# Patient Record
Sex: Female | Born: 1950
Health system: Southern US, Community
[De-identification: ages and names within clinical notes are randomized; demographics above are authoritative.]

## PROBLEM LIST (undated history)

## (undated) DIAGNOSIS — K649 Unspecified hemorrhoids: Secondary | ICD-10-CM

## (undated) DIAGNOSIS — I2510 Atherosclerotic heart disease of native coronary artery without angina pectoris: Secondary | ICD-10-CM

## (undated) DIAGNOSIS — R112 Nausea with vomiting, unspecified: Secondary | ICD-10-CM

## (undated) DIAGNOSIS — N3090 Cystitis, unspecified without hematuria: Secondary | ICD-10-CM

## (undated) DIAGNOSIS — Z9989 Dependence on other enabling machines and devices: Secondary | ICD-10-CM

## (undated) DIAGNOSIS — I2584 Coronary atherosclerosis due to calcified coronary lesion: Secondary | ICD-10-CM

## (undated) DIAGNOSIS — G4733 Obstructive sleep apnea (adult) (pediatric): Secondary | ICD-10-CM

## (undated) DIAGNOSIS — E785 Hyperlipidemia, unspecified: Secondary | ICD-10-CM

## (undated) DIAGNOSIS — K219 Gastro-esophageal reflux disease without esophagitis: Secondary | ICD-10-CM

## (undated) DIAGNOSIS — M1990 Unspecified osteoarthritis, unspecified site: Secondary | ICD-10-CM

## (undated) DIAGNOSIS — R0902 Hypoxemia: Secondary | ICD-10-CM

## (undated) DIAGNOSIS — Z9889 Other specified postprocedural states: Secondary | ICD-10-CM

## (undated) DIAGNOSIS — I10 Essential (primary) hypertension: Secondary | ICD-10-CM

## (undated) DIAGNOSIS — G4730 Sleep apnea, unspecified: Secondary | ICD-10-CM

## (undated) DIAGNOSIS — I8390 Asymptomatic varicose veins of unspecified lower extremity: Secondary | ICD-10-CM

## (undated) HISTORY — DX: Hypoxemia: R09.02

## (undated) HISTORY — DX: Obstructive sleep apnea (adult) (pediatric): Z99.89

## (undated) HISTORY — PX: POLYPECTOMY: SHX149

## (undated) HISTORY — DX: Atherosclerotic heart disease of native coronary artery without angina pectoris: I25.10

## (undated) HISTORY — PX: PAROTID GLAND TUMOR EXCISION: SHX5221

## (undated) HISTORY — PX: DILATION AND CURETTAGE OF UTERUS: SHX78

## (undated) HISTORY — PX: COLONOSCOPY W/ POLYPECTOMY: SHX1380

## (undated) HISTORY — PX: KNEE SURGERY: SHX244

## (undated) HISTORY — PX: WRIST SURGERY: SHX841

## (undated) HISTORY — PX: TONSILLECTOMY: SUR1361

## (undated) HISTORY — PX: WISDOM TOOTH EXTRACTION: SHX21

## (undated) HISTORY — PX: TOTAL KNEE ARTHROPLASTY: SHX125

## (undated) HISTORY — DX: Sleep apnea, unspecified: G47.30

## (undated) HISTORY — DX: Coronary atherosclerosis due to calcified coronary lesion: I25.84

## (undated) HISTORY — DX: Obstructive sleep apnea (adult) (pediatric): G47.33

## (undated) HISTORY — DX: Hyperlipidemia, unspecified: E78.5

## (undated) HISTORY — PX: EYE SURGERY: SHX253

## (undated) HISTORY — DX: Gastro-esophageal reflux disease without esophagitis: K21.9

## (undated) HISTORY — DX: Essential (primary) hypertension: I10

## (undated) HISTORY — PX: TARSAL TUNNEL RELEASE: SUR1099

---

## 1998-12-27 ENCOUNTER — Ambulatory Visit (HOSPITAL_COMMUNITY): Admission: RE | Admit: 1998-12-27 | Discharge: 1998-12-27 | Payer: Self-pay | Admitting: Gastroenterology

## 1999-12-06 ENCOUNTER — Encounter: Admission: RE | Admit: 1999-12-06 | Discharge: 1999-12-06 | Payer: Self-pay | Admitting: Obstetrics and Gynecology

## 1999-12-06 ENCOUNTER — Encounter: Payer: Self-pay | Admitting: Obstetrics and Gynecology

## 1999-12-26 ENCOUNTER — Encounter: Admission: RE | Admit: 1999-12-26 | Discharge: 1999-12-26 | Payer: Self-pay | Admitting: Otolaryngology

## 1999-12-26 ENCOUNTER — Encounter: Payer: Self-pay | Admitting: Otolaryngology

## 2000-02-15 ENCOUNTER — Encounter (INDEPENDENT_AMBULATORY_CARE_PROVIDER_SITE_OTHER): Payer: Self-pay

## 2000-02-15 ENCOUNTER — Other Ambulatory Visit: Admission: RE | Admit: 2000-02-15 | Discharge: 2000-02-15 | Payer: Self-pay | Admitting: Obstetrics and Gynecology

## 2000-06-11 ENCOUNTER — Encounter (INDEPENDENT_AMBULATORY_CARE_PROVIDER_SITE_OTHER): Payer: Self-pay

## 2000-06-11 ENCOUNTER — Ambulatory Visit (HOSPITAL_COMMUNITY): Admission: RE | Admit: 2000-06-11 | Discharge: 2000-06-11 | Payer: Self-pay | Admitting: Obstetrics and Gynecology

## 2000-12-09 ENCOUNTER — Encounter: Payer: Self-pay | Admitting: Family Medicine

## 2000-12-09 ENCOUNTER — Encounter: Admission: RE | Admit: 2000-12-09 | Discharge: 2000-12-09 | Payer: Self-pay | Admitting: Family Medicine

## 2001-12-24 ENCOUNTER — Encounter: Admission: RE | Admit: 2001-12-24 | Discharge: 2001-12-24 | Payer: Self-pay | Admitting: Family Medicine

## 2001-12-24 ENCOUNTER — Encounter: Payer: Self-pay | Admitting: Family Medicine

## 2002-12-28 ENCOUNTER — Encounter: Payer: Self-pay | Admitting: Family Medicine

## 2002-12-28 ENCOUNTER — Encounter: Admission: RE | Admit: 2002-12-28 | Discharge: 2002-12-28 | Payer: Self-pay | Admitting: Family Medicine

## 2003-08-31 ENCOUNTER — Encounter: Admission: RE | Admit: 2003-08-31 | Discharge: 2003-08-31 | Payer: Self-pay | Admitting: Family Medicine

## 2003-09-28 ENCOUNTER — Encounter: Admission: RE | Admit: 2003-09-28 | Discharge: 2003-09-28 | Payer: Self-pay | Admitting: Family Medicine

## 2004-01-06 ENCOUNTER — Ambulatory Visit (HOSPITAL_COMMUNITY): Admission: RE | Admit: 2004-01-06 | Discharge: 2004-01-06 | Payer: Self-pay | Admitting: Family Medicine

## 2004-05-16 ENCOUNTER — Ambulatory Visit (HOSPITAL_COMMUNITY): Admission: RE | Admit: 2004-05-16 | Discharge: 2004-05-16 | Payer: Self-pay | Admitting: Gastroenterology

## 2004-06-08 ENCOUNTER — Other Ambulatory Visit: Admission: RE | Admit: 2004-06-08 | Discharge: 2004-06-08 | Payer: Self-pay | Admitting: Obstetrics and Gynecology

## 2004-07-17 ENCOUNTER — Inpatient Hospital Stay (HOSPITAL_COMMUNITY): Admission: RE | Admit: 2004-07-17 | Discharge: 2004-07-20 | Payer: Self-pay | Admitting: Orthopedic Surgery

## 2004-07-17 ENCOUNTER — Ambulatory Visit: Payer: Self-pay | Admitting: Physical Medicine & Rehabilitation

## 2005-01-16 ENCOUNTER — Ambulatory Visit (HOSPITAL_COMMUNITY): Admission: RE | Admit: 2005-01-16 | Discharge: 2005-01-16 | Payer: Self-pay | Admitting: Obstetrics and Gynecology

## 2005-06-14 ENCOUNTER — Other Ambulatory Visit: Admission: RE | Admit: 2005-06-14 | Discharge: 2005-06-14 | Payer: Self-pay | Admitting: Obstetrics and Gynecology

## 2005-07-02 ENCOUNTER — Inpatient Hospital Stay (HOSPITAL_COMMUNITY): Admission: RE | Admit: 2005-07-02 | Discharge: 2005-07-05 | Payer: Self-pay | Admitting: Orthopedic Surgery

## 2006-01-23 ENCOUNTER — Ambulatory Visit (HOSPITAL_COMMUNITY): Admission: RE | Admit: 2006-01-23 | Discharge: 2006-01-23 | Payer: Self-pay | Admitting: Family Medicine

## 2006-05-23 ENCOUNTER — Encounter: Admission: RE | Admit: 2006-05-23 | Discharge: 2006-05-23 | Payer: Self-pay | Admitting: Orthopedic Surgery

## 2006-08-09 ENCOUNTER — Ambulatory Visit (HOSPITAL_COMMUNITY): Admission: RE | Admit: 2006-08-09 | Discharge: 2006-08-09 | Payer: Self-pay | Admitting: Orthopedic Surgery

## 2006-12-06 ENCOUNTER — Ambulatory Visit (HOSPITAL_COMMUNITY): Admission: RE | Admit: 2006-12-06 | Discharge: 2006-12-06 | Payer: Self-pay | Admitting: Orthopedic Surgery

## 2007-01-29 ENCOUNTER — Ambulatory Visit (HOSPITAL_COMMUNITY): Admission: RE | Admit: 2007-01-29 | Discharge: 2007-01-29 | Payer: Self-pay | Admitting: Obstetrics and Gynecology

## 2007-02-06 ENCOUNTER — Encounter: Admission: RE | Admit: 2007-02-06 | Discharge: 2007-02-06 | Payer: Self-pay | Admitting: Obstetrics and Gynecology

## 2007-08-11 ENCOUNTER — Ambulatory Visit (HOSPITAL_COMMUNITY): Admission: RE | Admit: 2007-08-11 | Discharge: 2007-08-11 | Payer: Self-pay | Admitting: Obstetrics and Gynecology

## 2007-08-11 ENCOUNTER — Encounter (INDEPENDENT_AMBULATORY_CARE_PROVIDER_SITE_OTHER): Payer: Self-pay | Admitting: Obstetrics and Gynecology

## 2008-02-10 ENCOUNTER — Ambulatory Visit (HOSPITAL_COMMUNITY): Admission: RE | Admit: 2008-02-10 | Discharge: 2008-02-10 | Payer: Self-pay | Admitting: Family Medicine

## 2008-05-17 ENCOUNTER — Ambulatory Visit: Payer: Self-pay | Admitting: Vascular Surgery

## 2008-10-22 HISTORY — PX: COLONOSCOPY: SHX174

## 2009-02-16 ENCOUNTER — Ambulatory Visit (HOSPITAL_COMMUNITY): Admission: RE | Admit: 2009-02-16 | Discharge: 2009-02-16 | Payer: Self-pay | Admitting: Obstetrics and Gynecology

## 2009-05-11 ENCOUNTER — Ambulatory Visit: Payer: Self-pay | Admitting: Internal Medicine

## 2009-05-24 ENCOUNTER — Encounter: Payer: Self-pay | Admitting: Internal Medicine

## 2009-05-24 ENCOUNTER — Ambulatory Visit: Payer: Self-pay | Admitting: Internal Medicine

## 2009-05-27 ENCOUNTER — Encounter: Payer: Self-pay | Admitting: Internal Medicine

## 2010-02-21 ENCOUNTER — Ambulatory Visit (HOSPITAL_COMMUNITY): Admission: RE | Admit: 2010-02-21 | Discharge: 2010-02-21 | Payer: Self-pay | Admitting: Family Medicine

## 2010-11-12 ENCOUNTER — Encounter: Payer: Self-pay | Admitting: Obstetrics and Gynecology

## 2010-12-28 ENCOUNTER — Encounter: Payer: Self-pay | Admitting: Internal Medicine

## 2010-12-28 ENCOUNTER — Encounter (INDEPENDENT_AMBULATORY_CARE_PROVIDER_SITE_OTHER): Payer: BC Managed Care – PPO | Admitting: Internal Medicine

## 2010-12-28 DIAGNOSIS — K219 Gastro-esophageal reflux disease without esophagitis: Secondary | ICD-10-CM

## 2010-12-28 DIAGNOSIS — E559 Vitamin D deficiency, unspecified: Secondary | ICD-10-CM | POA: Insufficient documentation

## 2010-12-28 DIAGNOSIS — Z860100 Personal history of colon polyps, unspecified: Secondary | ICD-10-CM | POA: Insufficient documentation

## 2010-12-28 DIAGNOSIS — Z0000 Encounter for general adult medical examination without abnormal findings: Secondary | ICD-10-CM

## 2010-12-28 DIAGNOSIS — I10 Essential (primary) hypertension: Secondary | ICD-10-CM

## 2010-12-28 DIAGNOSIS — J309 Allergic rhinitis, unspecified: Secondary | ICD-10-CM | POA: Insufficient documentation

## 2011-01-02 NOTE — Assessment & Plan Note (Signed)
Summary: NEW/CPX/PH/PH   Vital Signs:  Patient profile:   60 year old female Height:      65.75 inches Weight:      264.4 pounds BMI:     43.16 Temp:     98.6 degrees F oral Pulse rate:   64 / minute Resp:     14 per minute BP sitting:   118 / 82  (left arm) Cuff size:   large  Vitals Entered By: Shonna Chock CMA (December 28, 2010 9:52 AM) CC: New patient: CPX-NOT fasting , Heartburn   CC:  New patient: CPX-NOT fasting  and Heartburn.  History of Present Illness:    [name]) is here for a physical; she has some constipation ? related to ACE-I/HCTZ every other day . She reports edema and  intermittent fatigue, but denies lightheadedness, urinary frequency,  headaches, chest pain, exercise intolerance, dyspnea, palpitations, and syncope.  The patient reports that dietary compliance has been good.  The patient reports exercising 6X/ week , walking  50  min.  Adjunctive measures currently used by the patient include salt restriction.      GERD: She  denies acid reflux, sour taste in mouth, epigastric pain, trouble swallowing, weight loss, and weight gain.  The patient denies the following alarm features: melena, dysphagia, hematemesis, and vomiting.  Prior evaluation has included EGD.    Preventive Screening-Counseling & Management  Alcohol-Tobacco     Smoking Status: quit  Allergies (verified): No Known Drug Allergies  Past History:  Past Medical History: Allergic rhinitis GERD Hypertension Colonic polyps, PMH  of, Hyperplastic ; DUD , PMH of 1970 Vitamin D deficiency , PMH of   Past Surgical History: Hip surgery 1968 post BB injury;bladder surgery 1972 ; Wrist surgery  1995 & 1996  post pregnancy; Benign Parotid tumor resection 1996;Ankle surgery ; Knee sugery X7( 2 TKR), now seeing  Dr Despina Hick Tonsillectomy; Colonoscopy every 5 years, last 2010; Upper Endo: neg , Dr Sherin Quarry; Dewaine Conger surgeries X2 bilaterally  Family History: Father: Living, Colon/Prostate cancer,  HTN Mother: Living: Colon cancer;MGM: Colon Cancer Siblings: 2 Sister Living  Social History: Retired Married Former Smoker Alcohol use-yes; 1x /month Smoking Status:  quit  Review of Systems  The patient denies anorexia, fever, vision loss, decreased hearing, prolonged cough, hemoptysis, hematuria, suspicious skin lesions, depression, unusual weight change, abnormal bleeding, enlarged lymph nodes, and angioedema.    Physical Exam  General:  well-nourished; alert,appropriate and cooperative throughout examination Head:  Normocephalic and atraumatic without obvious abnormalities. Eyes:  No corneal or conjunctival inflammation noted. Perrla. Funduscopic exam benign, without hemorrhages, exudates or papilledema.  Ears:  External ear exam shows no significant lesions or deformities.  Otoscopic examination reveals clear canals, tympanic membranes are intact bilaterally without bulging, retraction, inflammation or discharge. Hearing is grossly normal bilaterally. Nose:  External nasal examination shows no deformity or inflammation. Nasal mucosa are pink and moist without lesions or exudates. Mouth:  Oral mucosa and oropharynx without lesions or exudates.  Teeth in good repair. Neck:  No deformities, masses, or tenderness noted. Lungs:  Normal respiratory effort, chest expands symmetrically. Lungs are clear to auscultation, no crackles or wheezes. Heart:  Normal rate and regular rhythm. S1 and S2 normal without gallop, murmur, click, rub. S4 Abdomen:  Bowel sounds positive,abdomen soft and non-tender without masses, organomegaly or hernias noted. Genitalia:  as per Gyn Msk:  No deformity or scoliosis noted of thoracic or lumbar spine.   Pulses:  R and L carotid,radial,dorsalis pedis  and posterior tibial pulses are full and equal bilaterally Extremities:  No clubbing, cyanosis, edema, or deformity noted with normal full range of motion of all joints.   Neurologic:  alert & oriented X3 and DTRs  symmetrical ; 1/2 + @ knees Skin:  Intact without suspicious lesions or rashes Cervical Nodes:  No lymphadenopathy noted Axillary Nodes:  No palpable lymphadenopathy Psych:  memory intact for recent and remote, normally interactive, and good eye contact.     Impression & Recommendations:  Problem # 1:  ROUTINE GENERAL MEDICAL EXAM@HEALTH  CARE FACL (ICD-V70.0)  Orders: EKG w/ Interpretation (93000)  Problem # 2:  HYPERTENSION (ICD-401.9)  Her updated medication list for this problem includes:    Furosemide 40 Mg Tabs (Furosemide) .Marland KitchenMarland KitchenMarland KitchenMarland Kitchen 3 times weekly    Zestoretic 10-12.5 Mg Tabs (Lisinopril-hydrochlorothiazide) .Marland Kitchen... 1/2 by mouth every other day  Problem # 3:  GERD (ICD-530.81)  Her updated medication list for this problem includes:    Zegerid 40-1100 Mg Caps (Omeprazole-sodium bicarbonate) .Marland Kitchen... 1 by mouth once daily  Problem # 4:  COLONIC POLYPS, HX OF (ICD-V12.72)  Problem # 5:  VITAMIN D DEFICIENCY (ICD-268.9)  Complete Medication List: 1)  Zegerid 40-1100 Mg Caps (Omeprazole-sodium bicarbonate) .Marland Kitchen.. 1 by mouth once daily 2)  Furosemide 40 Mg Tabs (Furosemide) .... 3 times weekly 3)  Vitamin D 1000 Unit Tabs (Cholecalciferol) .... Two times a day 4)  Klor-con 20 Meq Pack (Potassium chloride) .... 3 times weekly 5)  Zestoretic 10-12.5 Mg Tabs (Lisinopril-hydrochlorothiazide) .... 1/2 by mouth every other day 6)  Nasonex 50 Mcg/act Susp (Mometasone furoate) .... 2 spray in each nostril as needed 7)  Multivitamins Tabs (Multiple vitamin) .Marland Kitchen.. 1 by mouth once daily 8)  Calcium 600 Mg Tabs (Calcium) .... Two times a day  Patient Instructions: 1)  please schedule  fasting labs; Codes: V70.0, 401.9, 530.81, 268.9: 2)  BMP; 3)  Hepatic Panel; 4)  Lipid Panel ; 5)  TSH ; 6)  CBC w/ Diff. 7)  Check your Blood Pressure regularly. If it is above: 135/85 ON AVERAGE OFF Lisinopril/HCTZ  you should make an appointment.   Orders Added: 1)  Est. Patient 40-64 years [99396] 2)   EKG w/ Interpretation [93000] 3)  New Patient 40-64 years [99386]   Immunization History:  Tetanus/Td Immunization History:    Tetanus/Td:  historical (10/22/2002)  Pneumovax Immunization History:    Pneumovax:  historical (10/22/2004)   Immunization History:  Tetanus/Td Immunization History:    Tetanus/Td:  Historical (10/22/2002)  Pneumovax Immunization History:    Pneumovax:  Historical (10/22/2004)

## 2011-01-04 ENCOUNTER — Other Ambulatory Visit: Payer: Self-pay | Admitting: Internal Medicine

## 2011-01-04 ENCOUNTER — Encounter (INDEPENDENT_AMBULATORY_CARE_PROVIDER_SITE_OTHER): Payer: Self-pay | Admitting: *Deleted

## 2011-01-04 ENCOUNTER — Other Ambulatory Visit: Payer: BC Managed Care – PPO

## 2011-01-04 DIAGNOSIS — K219 Gastro-esophageal reflux disease without esophagitis: Secondary | ICD-10-CM

## 2011-01-04 DIAGNOSIS — Z0000 Encounter for general adult medical examination without abnormal findings: Secondary | ICD-10-CM

## 2011-01-04 DIAGNOSIS — I10 Essential (primary) hypertension: Secondary | ICD-10-CM

## 2011-01-04 DIAGNOSIS — E559 Vitamin D deficiency, unspecified: Secondary | ICD-10-CM

## 2011-01-04 LAB — BASIC METABOLIC PANEL
BUN: 19 mg/dL (ref 6–23)
CO2: 31 mEq/L (ref 19–32)
GFR: 65.02 mL/min (ref >60.00)
Potassium: 4.6 mEq/L (ref 3.5–5.1)

## 2011-01-04 LAB — HEPATIC FUNCTION PANEL
ALT: 23 U/L (ref 0–35)
AST: 26 U/L (ref 0–37)
Albumin: 3.8 g/dL (ref 3.5–5.2)
Alkaline Phosphatase: 62 U/L (ref 39–117)
Bilirubin, Direct: 0.2 mg/dL (ref 0.0–0.3)
Total Bilirubin: 1 mg/dL (ref 0.3–1.2)

## 2011-01-04 LAB — CBC WITH DIFFERENTIAL/PLATELET
Basophils Absolute: 0 10*3/uL (ref 0.0–0.1)
Eosinophils Absolute: 0.1 10*3/uL (ref 0.0–0.7)
HCT: 43.2 % (ref 36.0–46.0)
Lymphocytes Relative: 28.7 % (ref 12.0–46.0)
Lymphs Abs: 1.8 10*3/uL (ref 0.7–4.0)
MCV: 89.2 fl (ref 78.0–100.0)
Monocytes Absolute: 0.6 10*3/uL (ref 0.1–1.0)
Platelets: 215 10*3/uL (ref 150.0–400.0)
RBC: 4.85 Mil/uL (ref 3.87–5.11)
RDW: 14.1 % (ref 11.5–14.6)
WBC: 6.2 10*3/uL (ref 4.5–10.5)

## 2011-02-01 ENCOUNTER — Other Ambulatory Visit (HOSPITAL_COMMUNITY): Payer: Self-pay | Admitting: Obstetrics and Gynecology

## 2011-02-01 DIAGNOSIS — Z1231 Encounter for screening mammogram for malignant neoplasm of breast: Secondary | ICD-10-CM

## 2011-02-23 ENCOUNTER — Ambulatory Visit (HOSPITAL_COMMUNITY)
Admission: RE | Admit: 2011-02-23 | Discharge: 2011-02-23 | Disposition: A | Discharge status: Home / Self Care | Payer: BC Managed Care – PPO | Source: Ambulatory Visit | Attending: Obstetrics and Gynecology | Admitting: Obstetrics and Gynecology

## 2011-02-23 DIAGNOSIS — Z1231 Encounter for screening mammogram for malignant neoplasm of breast: Secondary | ICD-10-CM

## 2011-03-06 NOTE — Procedures (Signed)
DUPLEX DEEP VENOUS EXAM - LOWER EXTREMITY   INDICATION:  Bilateral leg swelling for approximately 10 years.  Father  has history of phlebitis.   HISTORY:  Edema:  Yes.  Trauma/Surgery:  Bilateral knee replacements in 2005 and 2006.  Pain:  In left foot.  PE:  No.  Previous DVT:  No.  Anticoagulants:  No.  Other:   DUPLEX EXAM:                CFV   SFV   PopV  PTV    GSV                R  L  R  L  R  L  R   L  R  L  Thrombosis    o  o  o  o  o  o  o   o  P  Spontaneous   +  +  +  +  +  +  +   +  P  Phasic        +  +  +  +  +  +  +   +  P  Augmentation  +  +  +  +  +  +  +   +  P  Compressible  +  +  +  +  +  +  +   +  P  Competent     +  0  +  +  +  +  +   +  +   Legend:  + - yes  o - no  p - partial  D - decreased   IMPRESSION:  1. No evidence of deep vein thrombosis bilaterally, however unable to      fully visualize peroneal veins bilaterally.  2. What appears to be chronic thrombus is noted in the greater      saphenous vein from the level of the ankle to the P/M calf.  3. Reflux is noted in the left common femoral vein at the      saphenofemoral junction.       _____________________________  Quita Skye Hart [place], M.D.   PB/MEDQ  D:  05/17/2008  T:  05/17/2008  Job:  [id number]

## 2011-03-06 NOTE — Consult Note (Signed)
VASCULAR SURGERY CONSULTATION   Sandra Stevens, Sandra Stevens  DOB:  07-28-1951                                       05/17/2008  CHART#:[id number]   This is a 60 year old female referred by Dr. Smith Mince for vascular  surgery consultation because of lower extremity edema and pain in the  left foot.  This patient states that she has had bilateral leg edema for  at least 10 years, left slightly worse than the right.  Causes  hypersensitivity and tenderness in the pretibial areas bilaterally with  progressive swelling as the day wears on.  She has no history of deep  venous thrombosis, thrombophlebitis, pulmonary emboli or any clotting  problems.  She has had no stasis ulcers or bleeding and has no  significant varicose veins.  She has tried wearing elastic stockings in  the past, but because they are so difficult to apply and to wear, she  has not worn them on a regular basis.  She has pain in her left foot  which is low grade discomfort which seems to be worse while she is lying  down.  She is able to walk about one mile per day.   PAST MEDICAL HISTORY:  Negative for diabetes, hypertension,  hyperlipidemia, coronary artery disease, COPD or stroke.   PREVIOUS SURGERY:  Bilateral knee replacements in 2005 and 2006 by Dr.  Salvatore Marvel.   FAMILY HISTORY:  Positive for phlebitis in her father.  Negative for  coronary artery disease, diabetes or stroke.   SOCIAL HISTORY:  She is married, has one child, is retired.  She has not  smoked cigarettes in 10 years and drinks occasional alcohol.   REVIEW OF SYSTEMS:  CARDIOVASCULAR:  Denies any chest pain, dyspnea on  exertion, PND, orthopnea.  RESPIRATORY:  No productive cough, bronchitis, asthma, or wheezing.  GENITOURINARY:  Does have urinary frequency.  EXTREMITIES:  Lower extremity discomfort as described above.   ALLERGIES:  None known.   MEDICATIONS:  Please see health history form.   PHYSICAL EXAM:  Blood pressure is  176/100, heart rate 71, respirations  14.  General:  She is an obese middle-aged female in no apparent  distress, alert and oriented x3.  Neck:  Supple.  3+ carotid pulses  palpable.  No bruits are audible.  Neurologic:  Normal.  No palpable  adenopathy in the neck.  Chest:  Clear to auscultation.  Cardiovascular:  Regular rhythm with no murmurs.  Upper extremity pulses 3+ bilaterally.  Abdomen is obese.  No palpable masses.  She has 3+ femoral, popliteal  and 2+ dorsalis pedis pulses palpable bilaterally.  Both feet well  perfused.  She has diffuse edema from the mid thighs down to the ankles,  slightly worse on the left than the right.  There is some mild erythema  associated with the edema.  No stasis ulcers hyperpigmentation or  significant varicosities are noted.  Both feet are well perfused with no  evidence of any ischemia.   Venous duplex exam was performed bilaterally.  There is no evidence of  deep venous thrombosis or serious severe reflux in the greater saphenous  veins.  She does have some possible old thrombus in the great saphenous  vein on the left and does have some reflux in the left common femoral  vein at the saphenofemoral junction.   I do not  see anything on a venous study to definitely account for why  she would be having severe bilateral edema.  I discussed with her at  length the treatment of this edema of unknown etiology as there are no  surgical procedures to alleviate this.  The treatment with the  diuretics, which she is currently taking, as well as elevation of the  legs at night and wearing effective long leg stockings during the day if  feasible if not short leg stockings to minimize swelling.  Unfortunately, I have nothing else to offer her regarding treatment  options.   Quita Skye Hart [place], M.D.  Electronically Signed  JDL/MEDQ  D:  05/17/2008  T:  05/18/2008  Job:  [id number]   cc:   Talmadge Coventry, M.D.

## 2011-03-06 NOTE — Op Note (Signed)
NAME:  Sandra Stevens, Sandra Stevens               ACCOUNT NO.:  [id number]   MEDICAL RECORD NO.:  [id number]          PATIENT TYPE:  AMB   LOCATION:  SDC                           FACILITY:  [facility]   PHYSICIAN:  Juluis Mire, M.D.   DATE OF BIRTH:  [birth year]   DATE OF PROCEDURE:  08/11/2007  DATE OF DISCHARGE:                               OPERATIVE REPORT   PREOPERATIVE DIAGNOSIS:  Postmenopausal bleeding with endometrial polyp.   POSTOPERATIVE DIAGNOSIS:  Postmenopausal bleeding with endometrial  polyp.   PROCEDURE:  Paracervical block.  Cervical dilatation with hysteroscopy.  Multiple endometrial resections and curettings.   SURGEON:  Juluis Mire, M.D.   ANESTHESIA:  With sedation with paracervical block.   ESTIMATED BLOOD LOSS:  Minimal.   PACKS AND DRAINS:  None.   INTRAOPERATIVE BLOOD REPLACED:  None.   COMPLICATIONS:  None.   INDICATIONS:  Noted in history and physical.   PROCEDURE:  Patient was taken to OR, placed supine position.  After  sedation was placed in dorsal lithotomy position using the Allen  stirrups.  The patient then draped sterile field.  Speculum was placed  in vaginal vault.  The cervix and vagina cleansed Betadine.  Paracervical blocks administered using 1% Xylocaine with epinephrine.  Cervix grasped with single toothed tenaculum.  Uterus sounded 8 cm.  Cervix serially dilated size 35 Pratt dilator.  Operative hysteroscope  was introduced into cavity was distended using sorbitol.  No polyp was  noted but it may have been disrupted by the dilation.  We did multiple  anterior and posterior wall biopsies as well as curettings.  There is no  signs of perforation or complications.  Total deficit was 85 mL.  At  this point time single-tooth tenaculum, speculum removed.  The patient  taken out of dorsal position.  Once alert, transferred to recovery in  good condition.  Sponge, instrument and needle count was correct by  circulating nurse x2 dictation.      Juluis Mire, M.D.  Electronically Signed     JSM/MEDQ  D:  08/11/2007  T:  08/11/2007  Job:  [id number]

## 2011-03-06 NOTE — H&P (Signed)
NAME:  Sandra Stevens, Sandra Stevens NO.:  [id number]   MEDICAL RECORD NO.:  [id number]          PATIENT TYPE:  AMB   LOCATION:  SDC                           FACILITY:  [facility]   PHYSICIAN:  Juluis Mire, M.D.   DATE OF BIRTH:  1951-01-12   DATE OF ADMISSION:  08/11/2007  DATE OF DISCHARGE:                              HISTORY & PHYSICAL   The patient is a 60 year old, gravida 1, para 1, postmenopausal patient  who presents for hysteroscopy.   The patient experienced postmenopausal bleeding, had a saline infusion  ultrasound with endometrial polyps. Therefore, presents for  hysteroscopic evaluation and resection.   ALLERGIES:  No known drug allergies listed.   MEDICATIONS:  Nexium.   PAST MEDICAL HISTORY:  Usual childhood diseases without any significant  sequelae.  Does have a history of gastroesophageal reflux disease and  ulcer disease, also history of migraine headaches.   PAST SURGICAL HISTORY:  1. Has had tonsillectomy and adenoidectomy.  2. Left hip surgery at age 30.  3. Bladder surgery in 1972.  4. She had previous wrist and ankle surgery after injury.  5. She had a parotid gland removed.  6. She has also had a previous hysteroscopy.   OBSTETRICAL HISTORY:  One vaginal delivery.   FAMILY HISTORY:  Is noncontributory.   SOCIAL HISTORY:  No tobacco or alcohol use.   REVIEW OF SYSTEMS:  Noncontributory.   PHYSICAL EXAMINATION:  VITAL SIGNS:  The patient is afebrile, stable  vital signs.  HEENT: The patient is normocephalic.  Pupils equal, round, reactive to  light and accommodation.  Extraocular movements were intact.  Sclerae  and conjunctivae are clear.  Oropharynx clear.  NECK:  Without thyromegaly.  BREASTS:  No discrete masses.  LUNGS:  Clear.  CARDIOVASCULAR:  Regular rhythm and rate without murmurs or gallops.  ABDOMEN:  Exam is benign.  PELVIC: Normal external genitalia.  Vaginal mucosa is clear.  Cervix  unremarkable.  Uterus normal size, shape,  and contour.  Adnexa free of  masses or tenderness.  EXTREMITIES:  Trace edema.  NEUROLOGIC:  Exam grossly within normal limits.   IMPRESSION:  Postmenopausal bleeding with endometrial polyp.   PLAN:  The patient will have a hysteroscopic evaluation with resection.  The risks have been discussed  including the risk of infection.  Risk of  hemorrhage, could require transfusion with the risk of AIDS or  hepatitis.  Risk of injury to adjacent organs including bladder, bowel  or ureters could require further exploratory surgery.  Risk of deep  venous thrombosis and pulmonary embolus.  The patient voiced  understanding of indications and risks.      Juluis Mire, M.D.  Electronically Signed     JSM/MEDQ  D:  08/11/2007  T:  08/11/2007  Job:  [id number]

## 2011-03-09 NOTE — Discharge Summary (Signed)
NAME:  Sandra Stevens, Sandra Stevens               ACCOUNT NO.:  [id number]   MEDICAL RECORD NO.:  [id number]          PATIENT TYPE:  INP   LOCATION:  5008                         FACILITY:  [facility]   PHYSICIAN:  Robert A. Thurston Hole, M.D. DATE OF BIRTH:  January 28, 1951   DATE OF ADMISSION:  07/17/2004  DATE OF DISCHARGE:  07/20/2004                                 DISCHARGE SUMMARY   ADMISSION DIAGNOSIS:  Left knee degenerative joint disease.   DISCHARGE DIAGNOSIS:  Status post total left knee replacement.   PROCEDURE:  On July 17, 2004, total knee replacement of left knee.   HISTORY OF PRESENT ILLNESS:  This is a 60 year old white female with a  history of end-stage DJD to both knees, left being worse than the right.  She was ready at this time to proceed with left knee replacement.   ALLERGIES:  1.  PENICILLIN.  2.  VIOXX.   PAST MEDICAL HISTORY:  1.  Allergies.  2.  Asthma.  3.  Peptic ulcer disease.  4.  Overactive bladder.   PHYSICAL EXAMINATION:  GENERAL APPEARANCE:  This is a well-developed, well-  nourished, 60 year old, white female.  HEENT:  Normocephalic and atraumatic.  Pupils equal and reactive to light.  NECK:  Supple.  CHEST:  CTA bilaterally.  HEART:  Regular rate and rhythm.  Normal S1 and S2.  ABDOMEN:  Positive bowel sounds.  Nontender, soft and obese.  EXTREMITIES:  Left knee pain greater than right knee pain.  Positive pulses  in both lower extremities, as well as upper extremities.  Positive motor and  positive sensory in all four extremities.  Skin warm and dry with no rashes.  Currently on physical exam, the patient's incision is clean, dry and intact.  The bandage is clean, dry and intact as well.  The patient is able to bear  weight on this as tolerated at this time.  The patient has positive motor,  vascular and sensory function in both legs bilaterally.  Calves were soft  and nontender at this time.   HOSPITAL COURSE:  She was operated on July 17, 2004,  when we performed  a total left knee replacement.  On postoperative day #1, the patient  reported doing well with pain under good control.  Her only complaint was  that her back was getting stiff and sore from being in bed so long.  On  examination, the patient was afebrile and vital signs were stable.  Her  dressing was clean, dry and intact.  She had positive sensory, positive  motor and positive vascular in all four extremities.  On postoperative day  #2, the patient had no change in exam and no complains at this time.  On  postoperative day #3, the patient had zero complaints or concerns.  The pain  was well controlled.  She reports sleeping well and doing well.  She did run  a fever of 101.8 degrees the previous day.  Previous to discharge today, we  obtained an upright chest x-ray which showed no acute process, just mild  atelectasis in the left lower lung field.  The  bandage was changed.  It was  clean, dry and intact.  The incision showed no erythema and was clean, dry  and intact as well and well approximated.  There was no change in exam from  previous day.   DISCHARGE CONDITION:  Good.   DISPOSITION:  She is going to be discharged to home.   DISCHARGE MEDICATIONS:  1.  Coumadin 5 mg to be taken as directed.  2.  The patient is Darvocet-N 100.  She is to take these one to two tablets      p.o. q.4-6h. p.r.n. pain.  3.  Mepergan fortis one to two tablets p.o. q.6h. p.r.n. pain.  4.  The patient is instructed to resume all previous medications.   ACTIVITY:  Weightbearing as tolerated.   DIET:  No restrictions.   WOUND CARE:  Dressing changes daily.  The patient is instructed if there is  any increasing pain or erythema around the incision, she is to contact us at  [phone].   FOLLOWUP:  Follow up in approximately one week.  Call [phone] to make  appointment with Molly Maduro A. Thurston Hole, M.D.       RWC/MEDQ  D:  07/20/2004  T:  07/20/2004  Job:  [id number]

## 2011-03-09 NOTE — Op Note (Signed)
NAME:  Sandra Stevens, Sandra Stevens               ACCOUNT NO.:  [id number]   MEDICAL RECORD NO.:  [id number]          PATIENT TYPE:  AMB   LOCATION:  DAY                          FACILITY:  [facility]   PHYSICIAN:  Ollen Gross, M.D.    DATE OF BIRTH:  11-Sep-1951   DATE OF PROCEDURE:  12/06/2006  DATE OF DISCHARGE:                               OPERATIVE REPORT   PREOPERATIVE DIAGNOSIS:  Right knee patellar clunk syndrome,  questionable patellar instability.   POSTOPERATIVE DIAGNOSIS:  Right knee patellar clunk syndrome,  questionable patellar instability.   PROCEDURE:  Right knee arthroscopy with synovectomy.   SURGEON:  Ollen Gross, MD.  No assistant.   ANESTHESIA:  General.   ESTIMATED BLOOD LOSS:  Minimal.   DRAINS:  None.   COMPLICATIONS:  This.   CONDITION:  Stable to recovery.   BRIEF CLINICAL NOTE:  Sandra Stevens is a 60 year old female who had a right  total knee arthroplasty performed a year and a half to 2 years ago.  Has  had a very painful popping sensation in the knee.  It was felt that she  may have had a snapping popliteus syndrome and resection of the  popliteus tendon off of the femur, months ago but, unfortunately, it did  not resolve the pain and popping.  She presents now for arthroscopic  evaluation as she now has signs and symptoms consistent with patellar  clunk syndrome.  She also has questionable patellar instability.  She  presents now for arthroscopy with evaluation of the joint.   PROCEDURE IN DETAIL:  After the successful initiation of general  anesthetic, a tourniquet was placed high on the right thigh and exam  under anesthesia performed.  Upon range of motion in the knee, the  patella appears to be tracking normally but going from about 70 degrees  of flexion to full extension, there is a very pronounced grinding in the  suprapatellar area as well as just lateral to the patella.  The thigh  and leg were then prepped and draped in the usual sterile  fashion.  Standard superomedial and inferolateral portals were created and the  inflow cannula passed superomedial and the camera passed inferolateral.  Arthroscopic visualization proceeds.  She indeed had a significant  lesion of hypertrophic synovitis at the junction of the quad tendon and  superior pole of the patella.  This was also present laterally between  the lateral retinaculum and the patella.  The patella was sitting  central in the groove and upon examining during range of motion it was  tracking normally but this tissue was getting entrapped between the box  of the femoral component and the patella.  Inferomedial portal was then  created and the ArthroCare device is placed as well a 4.2 mm shaver  alternating to debride this back to the stable quad tendon.  I also  needed to create a superolateral portal to get to the rest of the  hypertrophic synovial tissue.  Once this was all debrided, the joint was  again inspected and there was no other abnormally-appearing tissue that  potentially could be impinging.  I then removed the arthroscopic  equipment and placed the knee through a range of motion, and there was  absolutely  no popping.  It was a very smooth motion.  The patella was tracking  normally and there was no popping at all throughout full range of  motion.  We then closed the portals with interrupted 4-0 nylon and a  bulky sterile dressing is placed.  She is then awakened and transported  to recovery in stable condition.      Ollen Gross, M.D.  Electronically Signed     FA/MEDQ  D:  12/06/2006  T:  12/06/2006  Job:  [id number]

## 2011-03-09 NOTE — Discharge Summary (Signed)
NAMEAMILLION, SCOBEE               ACCOUNT NO.:  [id number]   MEDICAL RECORD NO.:  [id number]          PATIENT TYPE:  INP   LOCATION:  5017                         FACILITY:  [facility]   PHYSICIAN:  Elana Alm. Thurston Hole, M.D. DATE OF BIRTH:  1951-02-24   DATE OF ADMISSION:  07/02/2005  DATE OF DISCHARGE:  07/05/2005                                 DISCHARGE SUMMARY   ADMITTING DIAGNOSES:  1.  End-stage degenerative disk disease, right knee.  2.  Asthma.  3.  Gastroesophageal reflux.   DISCHARGE DIAGNOSES:  1.  End-stage degenerative disk disease, right knee, status post total knee      replacement.  2.  Asthma.  3.  Gastroesophageal reflux.   HISTORY OF PRESENT ILLNESS:  The patient is a 60 year old female with a  history of bilateral knee end-stage DJD.  She had a left total knee  replacement a year ago and has done very well with it.  Now, she is ready  for a right total knee replacement as she has failed conservative care with  her end-stage DJD.   PROCEDURES IN-HOUSE:  On July 02, 2005, the patient underwent a right  total knee replacement by Dr. Thurston Hole and a right femoral nerve block by  Anesthesia and tolerated both procedures well.   HOSPITAL COURSE:  She was admitted postoperatively for pain control, being  treated for prophylaxis, and physical therapy.  Postop day one, the patient  was doing well, had some difficulty with nausea.  Hemoglobin is 12.4.  She  is metabolically stable.  Her INR is 1.3.  Her drain is dc'd today.  PCA is  dc'd today.  She is placed on hydrocodone for pain.  Her dressing is  changed.  Postop day two, the patient was doing well.  She had no complaints  of pain.  Her temperature was 100.4, INR was 1.7, hemoglobin was 12.6.  She  was metabolically stable.  The surgical wounds were well approximated.  She  was given a suppository to move her bowels.  Postop day three, the patient  was doing very well and ambulating well with no more assistance.   Hemoglobin  was 12.0, INR was 1.8.  Surgical wound was well-approximated and healed.  She was discharged to home in stable condition, weightbearing as tolerated,  on a regular diet.   DISCHARGE MEDICATIONS:  1.  Norco 5/325 mg 1-2 q.4-6h p.r.n. pain.  2.  Robaxin 500 mg 1 q.4-6h p.r.n. pain.  3.  Ambien 10 mg 1 p.o. q.h.s.  4.  Coumadin 5 mg 1 tablet by mouth.  5.  Cipro 500 mg b.i.d. until finished.  6.  Trazodone, do not take, as she was given Ambien for sleep.   DISCHARGE INSTRUCTIONS:  She has been instructed to call with increased  pain, increased swelling, increased redness, or a temp greater than 101.  She has been instructed to use her _________190 degrees eight hours a day  and elevate her right heel on a folded pillow.  She will follow up with [place]  in the office on July 16, 2005.  Kirstin Shepperson, P.A.      Robert A. Thurston Hole, M.D.  Electronically Signed    KS/MEDQ  D:  08/28/2005  T:  08/28/2005  Job:  [id number]

## 2011-03-09 NOTE — Op Note (Signed)
NAMEALEXANDR, Sandra Stevens               ACCOUNT NO.:  [id number]   MEDICAL RECORD NO.:  [id number]          PATIENT TYPE:  INP   LOCATION:  2860                         FACILITY:  [facility]   PHYSICIAN:  Elana Alm. Thurston Hole, M.D. DATE OF BIRTH:  October 30, 1950   DATE OF PROCEDURE:  07/02/2005  DATE OF DISCHARGE:                                 OPERATIVE REPORT   PREOPERATIVE DIAGNOSIS:  Right knee degenerative joint disease.   POSTOPERATIVE DIAGNOSIS:  Right knee degenerative joint disease.   PROCEDURE:  1.  Right total knee replacement using DePuy cemented total knee system with      #3 cemented femur, #3 cemented tibia with 10 mm polyethylene RP tibial      spacer, and a 32 mm polyethylene cemented patella.  2.  Right total knee computer-assisted navigation.   SURGEON:  Elana Alm. Thurston Hole, M.D.   ASSISTANT:  Julien Girt, P.A.   ANESTHESIA:  General.   OPERATIVE TIME:  One hour 45 minutes.   COMPLICATIONS:  None.   DESCRIPTION OF PROCEDURE:  Sandra Stevens was brought to the operating room on  07/02/2005 and placed on the operative table in the supine position. She  received Ancef 1 gram intravenously preoperatively for prophylaxis. After  being placed under general anesthesia, she had a Foley catheter placed under  sterile conditions. Her right knee was examined. She had range of motion  from 0-125 degrees, mild varus deformity, knee stable to ligamentous exam  with normal patellar tracking. The right leg was prepped using sterile  DuraPrep and draped using sterile technique. The leg was exsanguinated and  the tourniquet elevated to 375 mm. Initially, through a 15 cm longitudinal  incision based over the patella, initial exposure was made.  The underlying subcutaneous tissues were incised along with the skin  incision. A median arthrotomy was performed revealing an excessive amount of  normal-appearing joint fluid. The articular surfaces were inspected. She had  grade 4 changes  medially, grade 3 changes laterally, and grade 3-4 changes  on the patellofemoral joint. The medial and lateral meniscal remnants were  removed as well as the anterior cruciate ligament. At this point, then, pins  were placed in the proximal tibia and distal femur for placement and  activation of the computer-assisted navigation system. The system was then  activated. Initial deformities noted at 0.5 degrees of varus deformity and 3  degrees of flexion deformity.  After the activation was achieved, the distal  femoral cut was made using the computer navigation system, resecting the  distal 10 mm off the distal femur. The distal femur was incised. The  computer navigation system and verifying as well lead to  three #3 size and  the #3 cutting jig was placed, and then these cuts were made again with  computer navigation making perfect and exact cuts. After this was done, the  proximal tibia was exposed. The proximal tibial cut was made also with the  computer navigation system resecting a 10 mm off the proximal tibia. At this  point, then, spacer blocks were used to test for flexion and extension gaps  and a 10 mm block gave perfect flexion and extension gaps noted. After this  was done, then the #3 tibial base plate was placed in the keel cut was made.  The PCL box cutter was then placed on the distal femur and these cuts were  made. At this point, with the #3 tibial trial and the #3 femoral trial and a  10 mm polyethylene RP tibial spacer trial, the knee was taken through a full  range of motion and found to give excellent full range of motion from 0-125  degrees with excellent stability and excellent correction of her deformities  verified by the computer navigation system. At this point, the patella was  sized. The patella was 22 mm in depth and a 14 mm resection cut was made and  then three locking holes were placed. Patellar tracking was then evaluated  and this was found to be normal.  At this point, it was felt that all the  trial components were of excellent size, fit, and stability. The computer  navigation system was deactivated and the pins were then removed. The trial  components were removed. The knee was then jet-lavaged and irrigated with 3  liters of saline solution and the proximal tibia was exposed and the  #3 tibial baseplate with cement backing was hammered into position with an  excellent fit with excess cement being removed from around the edges. The #3  femoral component with cement backing was hammered into position also with  an excellent fit with excess cement being removed with excess cement being  removed from around the edges. The 10 mm polyethylene RP tibial spacer was  placed on the tibial baseplate. The knee was taken through a range of motion  0-125 degrees with excellent stability and normal patellar tracking with the  32 mm polyethylene cement-backed patella. At this point, the wound was  further irrigated, tourniquet was released, hemostasis was obtained with  cautery, and then the arthrotomy was closed with #1 Ethibond sutures over  two medium Hemovac drains. Subcutaneous tissue was closed with 0-Vicryl and  2-0 Vicryl, subcuticular layer closed with 3-0 Monocryl. Steri-Strips were  applied. Sterile dressings were applied. Hemovac injected with 0.25%  Marcaine with epinephrine and 4 milligrams of morphine and clamped. The  patient was then awakened, extubated, and taken to the recovery room in  stable condition. Needle and sponge counts were correct x2 at the end of the  case.      Robert A. Thurston Hole, M.D.  Electronically Signed     RAW/MEDQ  D:  07/02/2005  T:  07/02/2005  Job:  [id number]

## 2011-03-09 NOTE — H&P (Signed)
[facility]  Patient:    Sandra Stevens, Sandra Stevens                      MRN: [id number] Adm. Date:  [id number] Attending:  Frederich Balding                         History and Physical  HISTORY OF PRESENT ILLNESS:  The patient is a 60 year old postmenopausal white female who presents for hysteroscopy with D&C for evaluation of abnormal uterine bleeding.  The patient is postmenopausal. She has been on hormone replacement therapy in the form of Activella. She has had continued abnormal bleeding. She has undergone a previous ultrasound and an endometrial sampling in the year 2000 with negative findings. We repeated the ultrasound due to continued bleeding and revealed a markedly thickened endometrium and possible small fibroids. In view of the continued bleeding and the ultrasound findings, we are going to proceed with hysteroscopy and D&C for further evaluation of the endometrium.  ALLERGIES:  She is allergic to PENICILLIN.  CURRENT MEDICATIONS:  Activella, Prevacid, Biaxin, and Trimox.  PAST MEDICAL HISTORY:  Usual childhood diseases. Does have a history of gastroesophageal reflux disease and/or ulcerative disease for which she is onnoted medications. Also a history of migraine headaches.  PAST SURGICAL HISTORY:  She has had a tonsillectomy and adenoidectomy, left hip surgery at age 11, bladder surgery in 1. She has had previous wrist and ankle surgery after an injury. She also had a parotic gland tumor removed. She has had one previous spontaneous vaginal delivery.  FAMILY HISTORY:  Noncontributory.  SOCIAL HISTORY:  She has no tobacco or alcohol use.  REVIEW OF SYSTEMS:  Noncontributory.  PHYSICAL EXAMINATION:  VITAL SIGNS:  The patient afebrile. Stable vital signs.  HEENT:  The patient normocephalic. Pupils equal, round, and reactive to light and accommodation. Extraocular movements were intact. Sclerae and conjunctivae clear. Oropharynx  clear.  NECK:  Without thyromegaly.  BREASTS:  No masses.  LUNGS:  Clear.  CARDIAC:  Regular rate and rhythm without murmurs or gallops.  ABDOMEN:  Benign.  PELVIC:  Normal external genitalia. Vaginal mucosa is clear. Cervix unremarkable. Uterus felt to be a normal size and shape. Adnexa unremarkable. Rectovaginal exam is clear.  EXTREMITIES:  Trace edema.  NEUROLOGICAL:  ______ .  IMPRESSION:  Abnormal postmenopausal bleeding rule out endometrial pathology.  PLAN:  The patient to undergo hysteroscopic evaluation with D&C. The risks of surgery have been discussed including the risks of anesthesia, the risk of infection, the risk of uterine perforation that could lead to injury to adjacent organs requiring subsequent diagnostic laparoscopy and possible exploratory laparotomy, the risks of ______  and pulmonary embolus. The patient appears to understand indication and risks. DD:  06/11/00 TD:  06/11/00 Job: [id number]

## 2011-03-09 NOTE — Op Note (Signed)
**Note Sandra-Identified via Obfuscation** NAME:  DEVYNN, Sandra Stevens               ACCOUNT NO.:  [id number]   MEDICAL RECORD NO.:  [id number]          PATIENT TYPE:  INP   LOCATION:  2550                         FACILITY:  [facility]   PHYSICIAN:  Robert A. Thurston Hole, M.D. DATE OF BIRTH:  Sep 30, 1951   DATE OF PROCEDURE:  07/17/2004  DATE OF DISCHARGE:                                 OPERATIVE REPORT   PREOPERATIVE DIAGNOSIS:  Left knee degenerative joint disease.   POSTOPERATIVE DIAGNOSIS:  Left knee degenerative joint disease.   PROCEDURE:  Left total knee replacement using [employer]onics Scorpio total knee  system with #7 cemented femur, #7 cemented femur, with 12 mm polyethylene  flex tibial spacer and 26 mm polyethylene cemented patella.   SURGEON:  Elana Alm. Thurston Hole, M.D.   ASSISTANT:  Cecil Cranker, P.A.   ANESTHESIA:  General.   OPERATIVE TIME:  1 hour 40 minutes.   COMPLICATIONS:  None.   DESCRIPTION OF PROCEDURE:  [name] was brought to the operating room on  July 17, 2004, placed on the operative table in supine position.  After  an adequate level of general anesthesia was obtained, she received  vancomycin 1 g IV preoperatively for prophylaxis.  She had a Foley catheter  placed under sterile conditions.  Her left knee was examined under  anesthesia.  Range of motion 0-125 degrees, 1-2+ crepitation, mild varus  deformity, knee stable to ligamentous exam with normal patellar tracking.  The left leg was prepped using sterile Duraprep and draped using sterile  technique.  The leg was exsanguinated and a thigh tourniquet elevated to 375  mm.  Initially through a 15 cm longitudinal incision based over the patella,  initial exposure was made.  The underlying subcutaneous tissues were incised  along the skin incision.  A median arthrotomy was performed revealing an  excessive amount of normal-appearing joint fluid.  The articular surfaces  were inspected.  She had grade 4 changes medially, grade 3 and 4 changes  laterally, and grade 3 and 4 changes in the patellofemoral joint.  The  medial and lateral meniscal remnants were removed as well as the anterior  cruciate ligament.  Osteophytes were removed off the femoral condyles and  tibial plateau.  Intramedullary drill was then drilled up the femoral canal  for placement of the distal femoral cutting jig, which was placed in the  appropriate amount of rotation and the distal 12 mm cut was made.  The  distal femoral sizer was placed.  The #7 was found to be the appropriate  size, a #7 cutting jig was placed, and then these cuts were made.  The  proximal tibia was then exposed.  The tibial spines were removed with an  oscillating saw.  Intramedullary drill drilled down the tibial canal for  placement of the proximal tibial cutting jig, which was placed in the  appropriate amount of rotation and a proximal 6 mm cut was made.  After this  was done, the Scorpio PCL cutter was placed back on the distal femur and  these cuts were made.  At this point the #7 femoral trial was  placed and the  #7 tibial base plate trial was placed and with the 12 mm polyethelene  spacer, there was found to be excellent restoration of normal alignment,  excellent stability, range of motion of 0-125 degrees.  The tibial base  plate was then marked for rotation and the keel cut was made.  After this  was done, the patella was sized.  A 26 mm was found to be the appropriate  size, and a recessed 26 x 10 mm cut was made and three locking holes were  placed.  After this was done, it was felt that all the trial components were  of excellent size, fit, and stability.  They were then removed.  The knee  was then jet-lavaged, irrigated with 3 L of saline solution.  The proximal  tibia was then exposed and the #7 tibial base plate with cement backing was  hammered into position with an excellent fit, with excess cement being  removed from around the edges.  The #7 femoral component with  cement backing  was hammered into position, also with an excellent fit, with excess cement  being removed from around the edges.  The 12 mm polyethylene flex tibial  spacer was locked on the tibial base plate.  The knee taken through a range  of motion of 0-125 degrees with excellent stability and no lift-off on the  tray.  The 26 mm polyethylene cement-backed patella was then locked in its  recessed hole and held there with a clamp.  After the cement hardened,  patellofemoral tracking was evaluated.  This was found to be normal.  At  this point it was felt that all the components were of excellent size, fit,  and stability.  The knee was further irrigated with saline and then the  arthrotomy was closed with #1 Ethibond suture over two medium Hemovac  drains.  Subcutaneous tissues closed with 0 and 2-0 Vicryl, skin closed with  skin staples, sterile dressings were applied.  Hemovac injected with 0.25%  Marcaine with epinephrine and clamped.  Tourniquet was released.  The  patient then had a femoral nerve block placed by anesthesia for  postoperative pain control.  She had a long-leg splint applied.  She was  awakened and extubated and taken to the recovery room in stable condition.  Needle and sponge count was correct x2 at the end of the case.       RAW/MEDQ  D:  07/17/2004  T:  07/17/2004  Job:  [id number]

## 2011-03-09 NOTE — Op Note (Signed)
NAME:  Sandra Stevens, Sandra Stevens               ACCOUNT NO.:  [id number]   MEDICAL RECORD NO.:  [id number]          PATIENT TYPE:  AMB   LOCATION:  DAY                          FACILITY:  [facility]   PHYSICIAN:  Ollen Gross, M.D.    DATE OF BIRTH:  Dec 05, 1950   DATE OF PROCEDURE:  08/09/2006  DATE OF DISCHARGE:                                 OPERATIVE REPORT   PREOPERATIVE DIAGNOSIS:  Painful impinging scar tissue right total knee  arthroplasty with snapping of the popliteus tendon.   POSTOPERATIVE DIAGNOSIS:  Painful impinging scar tissue right total knee  arthroplasty with snapping of the popliteus tendon.   PROCEDURE:  Right knee arthrotomy, tenotomy of the popliteus tendon,  excision of painful scar tissue.   SURGEON:  Ollen Gross, M.D.   SURGEON:  Alexzandrew L. Julien Girt, P.A.   ANESTHESIA:  General.   ESTIMATED BLOOD LOSS:  Minimal.   DRAINS:  None.   TOURNIQUET TIME:  Ten minutes at 300 mmHg.   COMPLICATIONS:  None.   CONDITION:  Stable to recovery.   BRIEF CLINICAL NOTE:  Sandra Stevens is a 60 year old female had uncomplicated  right total knee arthroplasty done a little over a year ago.  She has had  significant anterolateral posterolateral discomfort with this.  There is a  notable popping sensation upon range of motion.  There is also a grinding  anterolaterally.  She has had a bone scan which is negative.  Plain  radiographs look normal.  Due to this up persistent discomfort, she has  requested that we explore the knee and address any possible abnormalities.   PROCEDURE IN DETAIL:  After successful initiation of general anesthetic, a  tourniquet placed on the right thigh and the right lower extremity was  prepped and draped in the usual sterile fashion.  Extremity was wrapped in  Esmarch, knee flexed, tourniquet inflated to 300 mmHg.  Midline incision  made with 10 blade through subcutaneous tissue to the extensor mechanism.  I  placed the knee through a range of motion  and there was an obvious grinding  laterally.  We marked this in the tissues.  We then made a lateral  arthrotomy and explored the area.  There was excessive scar tissue that  potentially was catching on the mobile bearing.  I excised that scar tissue.  Placed it through a range of motion, it was also noted that the popliteus  tendon was snapping over a lateral marginal osteophyte just adjacent to the  femoral component.  The components looked fine.  The tibia component was not  overhanging the bone and the tibial polyethylene was not a overhanging the  component.  There was nothing trapped in there.  I removed that osteophyte  laterally.  The popliteus was still snapping over some soft tissue.  I  released the popliteus tendon.  This effectively led to resolution of all of  the snapping and all the grinding that was noted upon range of motion of the  knee.  We then thoroughly irrigated the knee joint with saline solution.  I  closed part of the arthrotomy and  placed it through range of motion again  and noticed that there was no popping present.  We closed the remainder of  the arthrotomy and there was just a small pop at the inferolateral tip of  the patella.  I removed the suture from that area and once I did that and  placed the knee through a range of motion, there was no further grinding or  popping noted.  There was a much smoother range of motion.  The knee was  extremely stable throughout range of motion.  We then irrigated further and  let the tourniquet down for total time of 10 minutes.  The subcutaneous  tissues were then closed interrupted 2-0 Vicryl , subcuticular running 4-0  Monocryl.  I placed approximately 20 mL of 0.25% Marcaine with epi through  the arthrotomy line and the subcutaneous tissues.  Subcuticular layers  closed with a running 4-0 Monocryl.  The incisions were cleaned and dried,  and Steri-Strips and a bulky sterile dressing applied.  She was then  awakened  and transported to recovery in stable condition.      Ollen Gross, M.D.  Electronically Signed     FA/MEDQ  D:  08/09/2006  T:  08/11/2006  Job:  [id number]

## 2011-03-09 NOTE — Op Note (Signed)
[facility]  Patient:    Sandra Stevens, GOUCHER                      MRN: [id number] Proc. Date: 06/11/00 Adm. Date:  [id number] Attending:  Frederich Balding                           Operative Report  PREOPERATIVE DIAGNOSIS:  Postmenopausal bleeding.  POSTOPERATIVE DIAGNOSIS:  Postmenopausal bleeding.  OPERATION PERFORMED:  Hysteroscopy with resection of endometrium.  SURGEON:  Dr. Arelia Sneddon.  ANESTHESIA:  Sedation with paracervical block.  ESTIMATED BLOOD LOSS:  Minimal.  PACKS/DRAINS:  None.  INTRAOPERATIVE BLOOD REPLACED:  None.  COMPLICATIONS:  None.  INDICATIONS FOR PROCEDURE:  Dictated in the history and physical.  DESCRIPTION OF PROCEDURE:  The patient was taken to the operating room, placed in supine position. After sedation was placed in the dorsal lithotomy position using Allen stirrups. The patient was then draped out for hysteroscopy. A speculum was placed in the vaginal vault. The cervix was anesthetized with 2% Anestacon. The cervix was grasped with a single tooth tenaculum.  The uterus sounded to 8 cm. The cervix was serially dilated to a size 31 Pratt dilator. We then introduced the hysteroscope. We were unable to dilate the intrauterine cavity adequately enough so the resectoscope was brought in place. With this, we had a good visualization of the endometrium. There was no evidence of any type of endometrial polyps or fibroids. Multiple endometrial samplings were obtained with the resectoscope and sent for pathological review. We had good hemostasis and no evidence of perforation or any active disease. The scope and single tooth tenaculum were then removed. No active bleeding was noted. The speculum was then removed. The patient was taken out of the dorsal lithotomy position and once alert transferred to the recovery room in good condition. Sponge, needle and instrument counts were reported as correct by the circulating nurse. DD:   06/11/00 TD:  06/12/00 Job: [id number]

## 2011-05-18 ENCOUNTER — Encounter: Payer: Self-pay | Admitting: Internal Medicine

## 2011-05-18 ENCOUNTER — Ambulatory Visit (INDEPENDENT_AMBULATORY_CARE_PROVIDER_SITE_OTHER): Payer: BC Managed Care – PPO | Admitting: Internal Medicine

## 2011-05-18 VITALS — BP 122/80 | HR 72 | Temp 98.7°F | Wt 263.6 lb

## 2011-05-18 DIAGNOSIS — G4730 Sleep apnea, unspecified: Secondary | ICD-10-CM

## 2011-05-18 DIAGNOSIS — R609 Edema, unspecified: Secondary | ICD-10-CM

## 2011-05-18 DIAGNOSIS — I872 Venous insufficiency (chronic) (peripheral): Secondary | ICD-10-CM

## 2011-05-18 DIAGNOSIS — I8310 Varicose veins of unspecified lower extremity with inflammation: Secondary | ICD-10-CM

## 2011-05-18 NOTE — Progress Notes (Signed)
Addended by: Legrand Como on: 05/18/2011 02:45 PM   Modules accepted: Orders

## 2011-05-18 NOTE — Patient Instructions (Signed)
Boil 1 cup of  salt in a gallon of water. After cooling place in  a clean jar. Use wet-to-dry compresses over the shins twice a day. For the dry skin in this area, apply Eucerin 1-2 times a day. If the skin dries  excessively, bacteria can enter into the subcutaneous tissues and  cause cellulitis & DVT as  discussed

## 2011-05-18 NOTE — Progress Notes (Signed)
  Subjective:    Patient ID: Sandra Stevens, female    DOB: Jun 09, 1951, 60 y.o.   MRN: [id number]  HPI Edema Onset:7 years ago Constitutional:  No Fatigue;  Sleep Apnea , on CPAP X 3 years                                                   Cardiovascular: no  Palpitations; tachycardia; claudication; paroxysmal nocturnal dyspnea Pulmonary:no Cough; sputum production; exertional dyspnea Endocrinologic:no Weight change; temperature intolerance; skin/hair/nail changes Possible triggers: noSalt  excess; new medications  ( not on Amlodipine)  Labs March 2012 reviewed; hepatic and renal function tests were normal.  She previously was on furosemide 40 mg daily but this was decreased to 3 times a week by the physician she saw her primary care doctor retired.  Dr. Lestine Box, the orthopedist was concerned about the erythema over the shins. He placed her on antibiotics for 3 weeks to treat this. This has not significantly affected the redness. This is actually been a chronic intermittent problem for years.  She saw Dr. [place], cardiologist in 2011. There was no evidence of ischemic cardiovascular disease according to her report based on treadmill stress test      Review of Systems she denies fever, chills, or sweats.     Objective:   Physical Exam she exhibits no distress and increased work of breathing.  She has no scleral icterus or jaundice.  There is no lymphadenopathy about the head neck or axilla.  Her chest is clear with no rales, rhonchi, or wheezes  She has a regular rhythm with an S4 without gallops or rubs.  There is no neck vein distention at 15. There is no hepatojugular reflux.  The extremities reveal no true dependent edema. There is some lipomatous changes at the left lateral malleolus.  All pulses are intact and no bruits are noted.  There is faint erythema over the anterior shins . This is nontender to palpation. Homans sign is negative.        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 Clinically there is no cellulitis  #3 sleep apnea, on CPAP.  Plan: A BNP will be performed to assess cardiac function. If this is elevated, a 2 D echocardiogram would be performed  It is unlikely that the furosemide will have a significant impact on the lipedema and could deplete potassium.  Wet-to-dry saline dressings to the anterior shins would be recommended twice a day. Eucerin should be applied to prevent drying.  The pathophysiology of sleep apnea induced cardiac decompensation & cellulitis related to skin breakdown was discussed.

## 2011-05-19 LAB — BRAIN NATRIURETIC PEPTIDE: Brain Natriuretic Peptide: 18.2 pg/mL (ref 0.0–100.0)

## 2011-08-01 LAB — DIFFERENTIAL
Basophils Absolute: 0
Eosinophils Relative: 2
Lymphocytes Relative: 29
Lymphs Abs: 2.2
Monocytes Absolute: 0.8 — ABNORMAL HIGH
Neutro Abs: 4.4

## 2011-08-01 LAB — CBC
HCT: 44.8
MCV: 88.8
RBC: 5.04
WBC: 7.6

## 2011-08-23 HISTORY — PX: ANKLE SURGERY: SHX546

## 2011-09-11 ENCOUNTER — Telehealth: Payer: Self-pay | Admitting: Internal Medicine

## 2011-09-11 NOTE — Telephone Encounter (Signed)
OK 

## 2011-09-12 ENCOUNTER — Ambulatory Visit (INDEPENDENT_AMBULATORY_CARE_PROVIDER_SITE_OTHER): Payer: BC Managed Care – PPO | Admitting: *Deleted

## 2011-09-12 DIAGNOSIS — Z23 Encounter for immunization: Secondary | ICD-10-CM

## 2011-09-12 DIAGNOSIS — Z2911 Encounter for prophylactic immunotherapy for respiratory syncytial virus (RSV): Secondary | ICD-10-CM

## 2011-09-12 NOTE — Telephone Encounter (Signed)
Pt aware and will schedule appt 

## 2012-01-21 ENCOUNTER — Other Ambulatory Visit: Payer: Self-pay | Admitting: Internal Medicine

## 2012-01-21 DIAGNOSIS — Z1231 Encounter for screening mammogram for malignant neoplasm of breast: Secondary | ICD-10-CM

## 2012-02-28 ENCOUNTER — Ambulatory Visit (HOSPITAL_COMMUNITY)
Admission: RE | Admit: 2012-02-28 | Discharge: 2012-02-28 | Disposition: A | Discharge status: Home / Self Care | Payer: BC Managed Care – PPO | Source: Ambulatory Visit | Attending: Internal Medicine | Admitting: Internal Medicine

## 2012-02-28 DIAGNOSIS — Z1231 Encounter for screening mammogram for malignant neoplasm of breast: Secondary | ICD-10-CM

## 2012-03-03 ENCOUNTER — Encounter: Payer: Self-pay | Admitting: *Deleted

## 2012-06-03 ENCOUNTER — Encounter (HOSPITAL_COMMUNITY): Payer: Self-pay | Admitting: Pharmacist

## 2012-06-05 NOTE — H&P (Signed)
  Patient name  Sandra Stevens, Sandra Stevens DICTATION# [id number] CSN# [id number]  [facility], MD 06/05/2012 11:34 AM

## 2012-06-06 NOTE — H&P (Signed)
NAME:  Sandra Stevens, Sandra Stevens NO.:  [id number]  MEDICAL RECORD NO.:  [id number]  LOCATION:  [facility]                         FACILITY:  [facility]  PHYSICIAN:  Juluis Mire, M.D.   DATE OF BIRTH:  [birth year]  DATE OF ADMISSION:  05/30/2012 DATE OF DISCHARGE:                             HISTORY & PHYSICAL   DATE OF SURGERY:  June 11, 2012, at [facility].  HISTORY OF PRESENT ILLNESS:  The patient is a 61 year old postmenopausal patient, presents for hysteroscopy D and C, for evaluation of postmenopausal bleeding.  Previous saline infusion ultrasound did reveal recurrent endometrial polyp.  The patient has had previous hysteroscopic resection of benign polyp in 2008.  ALLERGIES:  In terms of allergies, no known drug allergies listed.  MEDICATIONS:  Zegerid 40 mg daily, furosemide 40 mg daily 3 times per week.  PAST MEDICAL HISTORY:  Usual childhood diseases.  No significant sequelae.  Does have a history of gastroesophageal reflux disorder. Also, history of migraine headaches.  PAST SURGICAL HISTORY:  She has had a tonsillectomy and adenoidectomy. Left hip surgery at age 35.  Bladder surgery in 1972.  Previous wrist and ankle surgery after injury.  Had parotid gland removed.  She has had 2 previous hysteroscopies.  SOCIAL HISTORY:  No tobacco use.  Occasional alcohol use.  FAMILY HISTORY:  Noncontributory.  REVIEW OF SYSTEMS:  Noncontributory.  PHYSICAL EXAMINATION:  VITAL SIGNS:  The patient is afebrile.  Stable vital signs. HEENT:  The patient is normocephalic.  Pupils equal, round, and reactive to light and accommodation.  Extraocular movements were intact.  Sclerae and conjunctivae were clear.  Oropharynx clear. NECK:  Without thyromegaly. BREASTS:  Not examined. LUNGS:  Clear. CARDIOVASCULAR:  Regular rate.  No murmurs or gallops. ABDOMEN:  Benign.  No mass, organomegaly, or tenderness. PELVIC:  Normal external genitalia.  Vaginal mucosa is clear.   Cervix unremarkable.  Uterus normal size, shape, and contour.  Adnexa free of mass or tenderness. EXTREMITIES:  Trace edema. NEUROLOGIC:  Grossly within normal limits.  IMPRESSION:  Recurrent endometrial polyp.  PLAN:  The patient will undergo hysteroscopic evaluation, resection of polyp along with dilation and curettage.  The risk of procedure have been discussed including the risk of infection.  The risk of hemorrhage that could require transfusion with the risk of AIDS or hepatitis. Excessive bleeding could require hysterectomy.  There is a risk of perforation with injury to adjacent organs, requiring further exploratory surgery.  Risk of deep venous thrombosis and pulmonary embolus.  The patient appears to understand indications and risks.     Juluis Mire, M.D.     JSM/MEDQ  D:  06/05/2012  T:  06/06/2012  Job:  [id number]

## 2012-06-10 ENCOUNTER — Encounter (HOSPITAL_COMMUNITY)
Admission: RE | Admit: 2012-06-10 | Discharge: 2012-06-10 | Disposition: A | Discharge status: Home / Self Care | Payer: BC Managed Care – PPO | Source: Ambulatory Visit | Attending: Obstetrics and Gynecology | Admitting: Obstetrics and Gynecology

## 2012-06-10 ENCOUNTER — Other Ambulatory Visit: Payer: Self-pay

## 2012-06-10 ENCOUNTER — Encounter (HOSPITAL_COMMUNITY): Payer: Self-pay

## 2012-06-10 HISTORY — DX: Nausea with vomiting, unspecified: R11.2

## 2012-06-10 HISTORY — DX: Other specified postprocedural states: Z98.890

## 2012-06-10 LAB — CBC
HCT: 46.6 % — ABNORMAL HIGH (ref 36.0–46.0)
Hemoglobin: 14.9 g/dL (ref 12.0–15.0)
MCHC: 32 g/dL (ref 30.0–36.0)
MCV: 91.7 fL (ref 78.0–100.0)
RDW: 14 % (ref 11.5–15.5)

## 2012-06-10 LAB — BASIC METABOLIC PANEL
BUN: 21 mg/dL (ref 6–23)
Chloride: 102 mEq/L (ref 96–112)
Creatinine, Ser: 0.83 mg/dL (ref 0.50–1.10)
GFR calc non Af Amer: 74 mL/min — ABNORMAL LOW (ref >90)
Glucose, Bld: 115 mg/dL — ABNORMAL HIGH (ref 70–99)
Potassium: 4.6 mEq/L (ref 3.5–5.1)

## 2012-06-10 NOTE — Pre-Procedure Instructions (Signed)
Previous EKG information and todays EKG reviewed by Drs Rodman Pickle and Cristela Blue. No orders given.

## 2012-06-10 NOTE — Patient Instructions (Addendum)
20 Sandra Stevens  06/10/2012   Your procedure is scheduled on:  06/11/12  Enter through the Main Entrance of [facility] at 7 AM.  Pick up the phone at the desk and dial [phone].   Call this number if you have problems the morning of surgery: [phone]   Remember:   Do not eat food:After Midnight.  Do not drink clear liquids: After Midnight.  Take these medicines the morning of surgery with A SIP OF WATER: NA   Do not wear jewelry, make-up or nail polish.  Do not wear lotions, powders, or perfumes. You may wear deodorant.  Do not shave 48 hours prior to surgery.  Do not bring valuables to the hospital.  Contacts, dentures or bridgework may not be worn into surgery.  Leave suitcase in the car. After surgery it may be brought to your room.  For patients admitted to the hospital, checkout time is 11:00 AM the day of discharge.   Patients discharged the day of surgery will not be allowed to drive home.  Name and phone number of your driver: [name]  [phone]  Special Instructions: CHG Shower Use Special Wash: 1/2 bottle night before surgery and 1/2 bottle morning of surgery.   Please read over the following fact sheets that you were given: Surgical Site Infection Prevention

## 2012-06-11 ENCOUNTER — Encounter (HOSPITAL_COMMUNITY)
Admission: RE | Disposition: A | Discharge status: Home / Self Care | Payer: Self-pay | Source: Ambulatory Visit | Attending: Obstetrics and Gynecology

## 2012-06-11 ENCOUNTER — Encounter (HOSPITAL_COMMUNITY): Payer: Self-pay | Admitting: Anesthesiology

## 2012-06-11 ENCOUNTER — Encounter (HOSPITAL_COMMUNITY): Payer: Self-pay | Admitting: *Deleted

## 2012-06-11 ENCOUNTER — Ambulatory Visit (HOSPITAL_COMMUNITY)
Admission: RE | Admit: 2012-06-11 | Discharge: 2012-06-11 | Disposition: A | Discharge status: Home / Self Care | Payer: BC Managed Care – PPO | Source: Ambulatory Visit | Attending: Obstetrics and Gynecology | Admitting: Obstetrics and Gynecology

## 2012-06-11 ENCOUNTER — Ambulatory Visit (HOSPITAL_COMMUNITY): Payer: BC Managed Care – PPO | Admitting: Anesthesiology

## 2012-06-11 DIAGNOSIS — N840 Polyp of corpus uteri: Secondary | ICD-10-CM | POA: Insufficient documentation

## 2012-06-11 DIAGNOSIS — Q5128 Other doubling of uterus, other specified: Secondary | ICD-10-CM | POA: Insufficient documentation

## 2012-06-11 DIAGNOSIS — Z01818 Encounter for other preprocedural examination: Secondary | ICD-10-CM | POA: Insufficient documentation

## 2012-06-11 DIAGNOSIS — N950 Postmenopausal bleeding: Secondary | ICD-10-CM | POA: Insufficient documentation

## 2012-06-11 DIAGNOSIS — Z01812 Encounter for preprocedural laboratory examination: Secondary | ICD-10-CM | POA: Insufficient documentation

## 2012-06-11 HISTORY — PX: OTHER SURGICAL HISTORY: SHX169

## 2012-06-11 SURGERY — DILATATION & CURETTAGE/HYSTEROSCOPY WITH RESECTOCOPE
Anesthesia: General | Site: Vagina | Wound class: Clean Contaminated

## 2012-06-11 MED ORDER — LIDOCAINE-EPINEPHRINE (PF) 1 %-1:200000 IJ SOLN
INTRAMUSCULAR | Status: DC | PRN
  Administered 2012-06-11: 19 mL

## 2012-06-11 MED ORDER — LIDOCAINE HCL (CARDIAC) 20 MG/ML IV SOLN
INTRAVENOUS | Status: DC | PRN
  Administered 2012-06-11: 100 mg via INTRAVENOUS

## 2012-06-11 MED ORDER — LACTATED RINGERS IV SOLN
INTRAVENOUS | Status: DC | PRN
  Administered 2012-06-11: 08:00:00 via INTRAVENOUS

## 2012-06-11 MED ORDER — KETOROLAC TROMETHAMINE 30 MG/ML IJ SOLN: 15.0000 mg | Freq: Once | INTRAMUSCULAR | Status: DC | PRN

## 2012-06-11 MED ORDER — FENTANYL CITRATE 0.05 MG/ML IJ SOLN
INTRAMUSCULAR | Status: DC | PRN
  Administered 2012-06-11: 100 ug via INTRAVENOUS

## 2012-06-11 MED ORDER — ONDANSETRON HCL 4 MG/2ML IJ SOLN
INTRAMUSCULAR | Status: AC
  Filled 2012-06-11: qty 2

## 2012-06-11 MED ORDER — FENTANYL CITRATE 0.05 MG/ML IJ SOLN: 25.0000 ug | INTRAMUSCULAR | Status: DC | PRN

## 2012-06-11 MED ORDER — KETOROLAC TROMETHAMINE 30 MG/ML IJ SOLN
INTRAMUSCULAR | Status: AC
  Filled 2012-06-11: qty 1

## 2012-06-11 MED ORDER — SCOPOLAMINE 1 MG/3DAYS TD PT72
MEDICATED_PATCH | TRANSDERMAL | Status: AC
  Administered 2012-06-11: 1.5 mg via TRANSDERMAL
  Filled 2012-06-11: qty 1

## 2012-06-11 MED ORDER — GLYCINE 1.5 % IR SOLN
Status: DC | PRN
  Administered 2012-06-11: 3000 mL

## 2012-06-11 MED ORDER — ONDANSETRON HCL 4 MG/2ML IJ SOLN
INTRAMUSCULAR | Status: DC | PRN
  Administered 2012-06-11: 4 mg via INTRAVENOUS

## 2012-06-11 MED ORDER — LIDOCAINE HCL (CARDIAC) 20 MG/ML IV SOLN
INTRAVENOUS | Status: AC
  Filled 2012-06-11: qty 5

## 2012-06-11 MED ORDER — SCOPOLAMINE 1 MG/3DAYS TD PT72
1.0000 | MEDICATED_PATCH | TRANSDERMAL | Status: DC
  Administered 2012-06-11: 1.5 mg via TRANSDERMAL

## 2012-06-11 MED ORDER — MIDAZOLAM HCL 2 MG/2ML IJ SOLN
INTRAMUSCULAR | Status: AC
  Filled 2012-06-11: qty 2

## 2012-06-11 MED ORDER — FENTANYL CITRATE 0.05 MG/ML IJ SOLN
INTRAMUSCULAR | Status: AC
  Filled 2012-06-11: qty 2

## 2012-06-11 MED ORDER — PROPOFOL 10 MG/ML IV EMUL
INTRAVENOUS | Status: DC | PRN
  Administered 2012-06-11: 200 mg via INTRAVENOUS

## 2012-06-11 MED ORDER — MIDAZOLAM HCL 5 MG/5ML IJ SOLN
INTRAMUSCULAR | Status: DC | PRN
  Administered 2012-06-11: 2 mg via INTRAVENOUS

## 2012-06-11 MED ORDER — DEXAMETHASONE SODIUM PHOSPHATE 10 MG/ML IJ SOLN
INTRAMUSCULAR | Status: AC
  Filled 2012-06-11: qty 1

## 2012-06-11 MED ORDER — PROPOFOL 10 MG/ML IV EMUL
INTRAVENOUS | Status: AC
  Filled 2012-06-11: qty 20

## 2012-06-11 MED ORDER — LACTATED RINGERS IV SOLN
INTRAVENOUS | Status: DC
  Administered 2012-06-11 (×2): 125 mL/h via INTRAVENOUS

## 2012-06-11 MED ORDER — LIDOCAINE-EPINEPHRINE (PF) 1 %-1:200000 IJ SOLN
INTRAMUSCULAR | Status: AC
  Filled 2012-06-11: qty 10

## 2012-06-11 MED ORDER — DEXAMETHASONE SODIUM PHOSPHATE 10 MG/ML IJ SOLN
INTRAMUSCULAR | Status: DC | PRN
  Administered 2012-06-11: 10 mg via INTRAVENOUS

## 2012-06-11 MED ORDER — KETOROLAC TROMETHAMINE 30 MG/ML IJ SOLN
INTRAMUSCULAR | Status: DC | PRN
  Administered 2012-06-11: 30 mg via INTRAVENOUS

## 2012-06-11 SURGICAL SUPPLY — 14 items
CANISTER SUCTION 2500CC (MISCELLANEOUS) ×2 IMPLANT
CATH ROBINSON RED A/P 16FR (CATHETERS) ×2 IMPLANT
CLOTH BEACON ORANGE TIMEOUT ST (SAFETY) ×2 IMPLANT
CONTAINER PREFILL 10% NBF 60ML (FORM) ×4 IMPLANT
ELECT REM PT RETURN 9FT ADLT (ELECTROSURGICAL) ×2
ELECTRODE REM PT RTRN 9FT ADLT (ELECTROSURGICAL) ×1 IMPLANT
GLOVE BIO SURGEON STRL SZ7 (GLOVE) ×4 IMPLANT
GOWN STRL REIN XL XLG (GOWN DISPOSABLE) ×3 IMPLANT
LOOP ANGLED CUTTING 22FR (CUTTING LOOP) ×1 IMPLANT
NDL SPNL 20GX3.5 QUINCKE YW (NEEDLE) ×1 IMPLANT
NEEDLE SPNL 20GX3.5 QUINCKE YW (NEEDLE) ×2 IMPLANT
PACK HYSTEROSCOPY LF (CUSTOM PROCEDURE TRAY) ×2 IMPLANT
TOWEL OR 17X24 6PK STRL BLUE (TOWEL DISPOSABLE) ×4 IMPLANT
WATER STERILE IRR 1000ML POUR (IV SOLUTION) ×2 IMPLANT

## 2012-06-11 NOTE — Preoperative (Signed)
Beta Blockers   Reason not to administer Beta Blockers:Not Applicable 

## 2012-06-11 NOTE — Transfer of Care (Signed)
Immediate Anesthesia Transfer of Care Note  Patient: Sandra Stevens  Procedure(s) Performed: Procedure(s) (LRB): DILATATION & CURETTAGE/HYSTEROSCOPY WITH RESECTOCOPE (N/A)  Patient Location: PACU  Anesthesia Type: General  Level of Consciousness: awake, alert  and patient cooperative  Airway & Oxygen Therapy: Patient Spontanous Breathing and Patient connected to nasal cannula oxygen  Post-op Assessment: Report given to PACU RN and Post -op Vital signs reviewed and stable  Post vital signs: Reviewed  Complications: No apparent anesthesia complications

## 2012-06-11 NOTE — H&P (Signed)
  History and physical exam unchanged 

## 2012-06-11 NOTE — Anesthesia Preprocedure Evaluation (Signed)
Anesthesia Evaluation  Patient identified by MRN, date of birth, ID band Patient awake    Reviewed: Allergy & Precautions, H&P , NPO status , Patient's Chart, lab work & pertinent test results, reviewed documented beta blocker date and time   History of Anesthesia Complications (+) PONV  Airway Mallampati: I TM Distance: >3 FB Neck ROM: full    Dental  (+) Teeth Intact   Pulmonary sleep apnea and Continuous Positive Airway Pressure Ventilation , former smoker (quit 13 years ago),  breath sounds clear to auscultation  Pulmonary exam normal       Cardiovascular Exercise Tolerance: Good hypertension (has been taken off of her meds), On Medications Rhythm:regular Rate:Normal  Stress test 2-3 years ago was normal per pt   Neuro/Psych negative neurological ROS  negative psych ROS   GI/Hepatic Neg liver ROS, GERD-  Medicated,  Endo/Other  Morbid obesity  Renal/GU negative Renal ROS  Female GU complaint     Musculoskeletal   Abdominal   Peds  Hematology negative hematology ROS (+)   Anesthesia Other Findings   Reproductive/Obstetrics negative OB ROS                           Anesthesia Physical Anesthesia Plan  ASA: III  Anesthesia Plan: General LMA   Post-op Pain Management:    Induction:   Airway Management Planned:   Additional Equipment:   Intra-op Plan:   Post-operative Plan:   Informed Consent: I have reviewed the patients History and Physical, chart, labs and discussed the procedure including the risks, benefits and alternatives for the proposed anesthesia with the patient or authorized representative who has indicated his/her understanding and acceptance.   Dental Advisory Given  Plan Discussed with: CRNA and Surgeon  Anesthesia Plan Comments:         Anesthesia Quick Evaluation

## 2012-06-11 NOTE — Op Note (Signed)
Patient name  Sandra Stevens, Sandra Stevens DICTATION#  [id number] CSN# [id number]  [facility], MD 06/11/2012 8:50 AM

## 2012-06-11 NOTE — Op Note (Signed)
NAME:  Sandra Stevens, Sandra Stevens NO.:  [id number]  MEDICAL RECORD NO.:  [id number]  LOCATION:  [facility]                          FACILITY:  [facility]  PHYSICIAN:  Juluis Mire, M.D.   DATE OF BIRTH:  1951/05/05  DATE OF PROCEDURE:  06/11/2012 DATE OF DISCHARGE:  06/11/2012                              OPERATIVE REPORT   PREOPERATIVE DIAGNOSIS:  Endometrial polyp.  POSTOPERATIVE DIAGNOSIS:  Endometrial septum.  OPERATIVE PROCEDURE:  Paracervical block, cervical dilatation, hysteroscopy with multiple endometrial biopsies, and endometrial curettings.  SURGEON:  Juluis Mire, M.D.  ANESTHESIA:  General with paracervical block.  ESTIMATED BLOOD LOSS:  Minimal.  PACKS AND DRAINS:  None.  INTRAOPERATIVE BLOOD PLACEMENT:  None.  COMPLICATIONS:  None.  INDICATION:  Dictated in the history and physical.  DESCRIPTION OF PROCEDURE:  The patient was taken to the OR and placed in supine position.  After satisfactory level of general anesthesia was obtained, the patient was placed in dorsal lithotomy position using the Allen stirrups.  The perineum and vagina were prepped out with Betadine and draped in a sterile field.  A speculum was placed in the vaginal vault.  The cervix was grasped with single-tooth tenaculum.  Uterus sounded to 9 cm.  Cervix serially dilated to a size 31 Pratt dilator. Operative hysteroscope was introduced and intrauterine cavity was distended using glycine visualization.  We can really see a polyp.  She had some thickened endometrium on the right side, we resected this.  She looked like she had a septum, I think that was showing on the saline infusion.  Multiple endometrial samplings were obtained with resectoscope.  Total deficit was 35 mL.  The hysteroscope was then removed and endometrial curettings were obtained.  There was no signs of perforation.  No active bleeding encountered.  The single-tooth tenaculum and speculum were removed.  The patient  was taken out of the dorsal lithotomy position.  Once alert, transferred to the recovery room in good condition.  Sponge, instrument, and needle count was reported as correct by circulating nurse x2.     Juluis Mire, M.D.     JSM/MEDQ  D:  06/11/2012  T:  06/11/2012  Job:  [id number]

## 2012-06-11 NOTE — Brief Op Note (Signed)
06/11/2012  8:51 AM  PATIENT:  Sandra Stevens  61 y.o. female  PRE-OPERATIVE DIAGNOSIS:  Polyp  POST-OPERATIVE DIAGNOSIS:  Polyp  PROCEDURE:  Procedure(s) (LRB): DILATATION & CURETTAGE/HYSTEROSCOPY WITH RESECTOCOPE (N/A)  SURGEON:  Surgeon(s) and Role:    * Juluis Mire, MD - Primary  PHYSICIAN ASSISTANT:   ASSISTANTS: none   ANESTHESIA:   local and general  EBL:  Total I/O In: 700 [I.V.:700] Out: 50 [Urine:50]  BLOOD ADMINISTERED:none  DRAINS: none   LOCAL MEDICATIONS USED:  XYLOCAINE with epinephrine  and Amount: 19 ml  SPECIMEN:  Source of Specimen:  endometrial resecions and currettings  DISPOSITION OF SPECIMEN:  PATHOLOGY  COUNTS:  YES  TOURNIQUET:  * No tourniquets in log *  DICTATION: .Other Dictation: Dictation Number [id number]  PLAN OF CARE: Discharge to home after PACU  PATIENT DISPOSITION:  PACU - hemodynamically stable.   Delay start of Pharmacological VTE agent (>24hrs) due to surgical blood loss or risk of bleeding: no

## 2012-06-11 NOTE — Anesthesia Postprocedure Evaluation (Signed)
Anesthesia Post Note  Patient: Sandra Stevens  Procedure(s) Performed: Procedure(s) (LRB): DILATATION & CURETTAGE/HYSTEROSCOPY WITH RESECTOCOPE (N/A)  Anesthesia type: General  Patient location: PACU  Post pain: Pain level controlled  Post assessment: Post-op Vital signs reviewed  Last Vitals:  Filed Vitals:   06/11/12 0930  BP: 134/78  Pulse: 70  Temp: 37.1 C  Resp: 16    Post vital signs: Reviewed  Level of consciousness: sedated  Complications: No apparent anesthesia complications

## 2012-06-17 ENCOUNTER — Encounter (HOSPITAL_COMMUNITY): Payer: Self-pay

## 2012-06-17 ENCOUNTER — Emergency Department (HOSPITAL_COMMUNITY): Payer: BC Managed Care – PPO

## 2012-06-17 ENCOUNTER — Emergency Department (HOSPITAL_COMMUNITY)
Admission: EM | Admit: 2012-06-17 | Discharge: 2012-06-17 | Disposition: A | Discharge status: Other Acute Inpatient Hospital | Payer: BC Managed Care – PPO | Source: Home / Self Care

## 2012-06-17 ENCOUNTER — Emergency Department (HOSPITAL_COMMUNITY)
Admission: EM | Admit: 2012-06-17 | Discharge: 2012-06-17 | Disposition: A | Discharge status: Home / Self Care | Payer: BC Managed Care – PPO | Attending: Emergency Medicine | Admitting: Emergency Medicine

## 2012-06-17 DIAGNOSIS — M79609 Pain in unspecified limb: Secondary | ICD-10-CM | POA: Insufficient documentation

## 2012-06-17 DIAGNOSIS — M79606 Pain in leg, unspecified: Secondary | ICD-10-CM

## 2012-06-17 DIAGNOSIS — R079 Chest pain, unspecified: Secondary | ICD-10-CM | POA: Insufficient documentation

## 2012-06-17 DIAGNOSIS — R0602 Shortness of breath: Secondary | ICD-10-CM | POA: Insufficient documentation

## 2012-06-17 DIAGNOSIS — I2510 Atherosclerotic heart disease of native coronary artery without angina pectoris: Secondary | ICD-10-CM | POA: Insufficient documentation

## 2012-06-17 LAB — URINALYSIS, ROUTINE W REFLEX MICROSCOPIC
Nitrite: NEGATIVE
Protein, ur: NEGATIVE mg/dL
Specific Gravity, Urine: 1.008 (ref 1.005–1.030)
Urobilinogen, UA: 0.2 mg/dL (ref 0.0–1.0)

## 2012-06-17 LAB — CBC
HCT: 45.6 % (ref 36.0–46.0)
MCV: 89.8 fL (ref 78.0–100.0)
RBC: 5.08 MIL/uL (ref 3.87–5.11)
RDW: 13.9 % (ref 11.5–15.5)
WBC: 8.7 10*3/uL (ref 4.0–10.5)

## 2012-06-17 LAB — D-DIMER, QUANTITATIVE: D-Dimer, Quant: 0.73 ug/mL-FEU — ABNORMAL HIGH (ref 0.00–0.48)

## 2012-06-17 LAB — BASIC METABOLIC PANEL
BUN: 19 mg/dL (ref 6–23)
CO2: 28 mEq/L (ref 19–32)
Chloride: 102 mEq/L (ref 96–112)
Creatinine, Ser: 0.82 mg/dL (ref 0.50–1.10)
GFR calc Af Amer: 87 mL/min — ABNORMAL LOW (ref >90)
Potassium: 4.3 mEq/L (ref 3.5–5.1)

## 2012-06-17 LAB — POCT I-STAT TROPONIN I: Troponin i, poc: 0 ng/mL (ref 0.00–0.08)

## 2012-06-17 LAB — PRO B NATRIURETIC PEPTIDE: Pro B Natriuretic peptide (BNP): 47.8 pg/mL (ref 0–125)

## 2012-06-17 MED ORDER — ENOXAPARIN SODIUM 100 MG/ML ~~LOC~~ SOLN
120.0000 mg | Freq: Once | SUBCUTANEOUS | Status: DC
  Filled 2012-06-17: qty 2

## 2012-06-17 MED ORDER — ENOXAPARIN SODIUM 120 MG/0.8ML ~~LOC~~ SOLN
120.0000 mg | Freq: Once | SUBCUTANEOUS | Status: AC
  Administered 2012-06-17: 120 mg via SUBCUTANEOUS
  Filled 2012-06-17: qty 0.8

## 2012-06-17 MED ORDER — IOHEXOL 350 MG/ML SOLN: 80.0000 mL | Freq: Once | INTRAVENOUS | Status: DC | PRN

## 2012-06-17 NOTE — ED Provider Notes (Signed)
History     CSN: [id number]  Arrival date & time 06/17/12  1201   First MD Initiated Contact with Patient 06/17/12 1546      Chief Complaint  Patient presents with  . Chest Pain  . Shortness of Breath    (Consider location/radiation/quality/duration/timing/severity/associated sxs/prior treatment) Patient is a 61 y.o. female presenting with chest pain and shortness of breath.  Chest Pain The chest pain began 3 - 5 days ago. Chest pain occurs intermittently. The chest pain is improving. The pain is currently at 2/10. The severity of the pain is mild. The quality of the pain is described as tightness. The pain does not radiate. Primary symptoms include shortness of breath. Pertinent negatives for primary symptoms include no fever, no cough, no abdominal pain, no nausea and no vomiting.    Shortness of Breath  Associated symptoms include chest pain and shortness of breath. Pertinent negatives include no fever, no rhinorrhea and no cough.    Past Medical History  Diagnosis Date  . GERD (gastroesophageal reflux disease)   . PONV (postoperative nausea and vomiting)   . HTN (hypertension)     no meds required per Dr Alwyn Ren  . SVD (spontaneous vaginal delivery)     x 1    Past Surgical History  Procedure Date  . Total knee arthroplasty '04 / '05    x2  . Ankle surgery Nov 2012  . Knee surgery     x7  . Wrist surgery     x2  . Tonsillectomy   . Wisdom tooth extraction   . Eye surgery     bilateral lasik eye surgery  . Dilation and curettage of uterus   . Tarsal tunnel release     Family History  Problem Relation Age of Onset  . Hypertension      History  Substance Use Topics  . Smoking status: Former Smoker -- 1.0 packs/day for 10 years    Types: Cigarettes  . Smokeless tobacco: Never Used   Comment: Quit 13 years ago as of 2012  . Alcohol Use: Yes     rare    OB History    Grav Para Term Preterm Abortions TAB SAB Ect Mult Living                  Review  of Systems  Constitutional: Negative for fever and chills.  HENT: Negative for congestion, rhinorrhea and neck pain.   Respiratory: Positive for shortness of breath. Negative for cough.   Cardiovascular: Positive for chest pain and leg swelling (bilateral legs, does not feel worse than normal).  Gastrointestinal: Negative for nausea, vomiting and abdominal pain.  Genitourinary: Negative.   Musculoskeletal:       Bilateral calf pain  Neurological: Negative for headaches.  All other systems reviewed and are negative.    Allergies  Review of patient's allergies indicates no known allergies.  Home Medications   Current Outpatient Rx  Name Route Sig Dispense Refill  . CALCIUM 600 PO Oral Take 1 tablet by mouth at bedtime.     Marland Kitchen VITAMIN D3 1000 UNITS PO CAPS Oral Take 2 capsules by mouth at bedtime.     . FUROSEMIDE 40 MG PO TABS Oral Take 40 mg by mouth 3 (three) times a week.     . MELOXICAM 7.5 MG PO TABS Oral Take 7.5 mg by mouth daily.    Marland Kitchen ONE-DAILY MULTI VITAMINS PO TABS Oral Take 1 tablet by mouth daily.      Marland Kitchen  OMEPRAZOLE-SODIUM BICARBONATE 40-1100 MG PO CAPS Oral Take 1 capsule by mouth at bedtime.     Marland Kitchen POTASSIUM CHLORIDE CRYS ER 20 MEQ PO TBCR Oral Take 20 mEq by mouth 3 (three) times a week. Take along with Furosemide.      BP 171/89  Pulse 68  Temp 99.2 F (37.3 C) (Oral)  Resp 16  SpO2 97%  Physical Exam  Nursing note and vitals reviewed. Constitutional: She is oriented to person, place, and time. She appears well-developed and well-nourished. No distress.       obese  HENT:  Head: Normocephalic and atraumatic.  Right Ear: External ear normal.  Left Ear: External ear normal.  Nose: Nose normal.  Mouth/Throat: Oropharynx is clear and moist.  Neck: Neck supple.  Cardiovascular: Normal rate, regular rhythm, normal heart sounds and intact distal pulses.   Pulmonary/Chest: Effort normal and breath sounds normal. No respiratory distress. She has no wheezes. She has  no rales. She exhibits no tenderness.  Abdominal: Soft. She exhibits no distension. There is no tenderness.  Musculoskeletal:       No pitting edema noted. Large legs bilaterally without erythema. Mild calf tenderness noted bilaterally  Neurological: She is alert and oriented to person, place, and time.  Skin: Skin is warm and dry. She is not diaphoretic.    ED Course  Procedures (including critical care time)  Labs Reviewed  BASIC METABOLIC PANEL - Abnormal; Notable for the following:    Glucose, Bld 106 (*)     GFR calc non Af Amer 75 (*)     GFR calc Af Amer 87 (*)     All other components within normal limits  D-DIMER, QUANTITATIVE - Abnormal; Notable for the following:    D-Dimer, Quant 0.73 (*)     All other components within normal limits  CBC  PRO B NATRIURETIC PEPTIDE  URINALYSIS, ROUTINE W REFLEX MICROSCOPIC  POCT I-STAT TROPONIN I   Dg Chest 2 View  06/17/2012  *RADIOLOGY REPORT*  Clinical Data: Chest pain and shortness of breath.  CHEST - 2 VIEW  Comparison: Chest x-ray 06/26/2005.  Findings: Lung volumes are normal.  No consolidative airspace disease.  No pleural effusions.  No pneumothorax.  No pulmonary nodule or mass noted.  Pulmonary vasculature and the cardiomediastinal silhouette are within normal limits. Atherosclerotic calcifications in the arch of the aorta.  IMPRESSION: 1. No radiographic evidence of acute cardiopulmonary disease. 2.  Atherosclerosis.   Original Report Authenticated By: Florencia Reasons, M.D.    Ct Angio Chest W/cm &/or Wo Cm  06/17/2012  *RADIOLOGY REPORT*  Clinical Data: Chest pain.  Shortness of breath.  Recent surgery.  CT ANGIOGRAPHY CHEST  Technique:  Multidetector CT imaging of the chest using the standard protocol during bolus administration of intravenous contrast. Multiplanar reconstructed images including MIPs were obtained and reviewed to evaluate the vascular anatomy.  Contrast:  80 ml Omnipaque 315  Comparison: 06/17/2012  radiographs  Findings: No filling defect is identified in the pulmonary arterial tree to suggest pulmonary embolus.  Coronary atherosclerotic calcification noted.  Small hiatal hernia is present.  The gallbladder is partially visualized and appears contracted.  No pathologic thoracic adenopathy observed.  No pleural effusion noted.  The lungs appear clear.  Mild thoracic spondylosis is present.  IMPRESSION:  1. No filling defect is identified in the pulmonary arterial tree to suggest pulmonary embolus. 2.  Coronary artery atherosclerosis. 3.  Small hiatal hernia. 4.  Mild thoracic spondylosis.   Original Report Authenticated  By: Dellia Cloud, M.D.      Date: 06/17/2012  Rate: 68  Rhythm: normal sinus rhythm  QRS Axis: left  Intervals: normal  ST/T Wave abnormalities: nonspecific T wave changes  Conduction Disutrbances:none  Narrative Interpretation:   Old EKG Reviewed: unchanged   1. Chest pain       MDM  61 yo female with intermittent chest pain and dyspnea, worst at night or with laying flat. Also has intermittent leg cramping and perhaps increased pedal edema on left leg. PE study negative. EKG not changed, not c/w ACS, trop neg. Doubt dissection with normal CT. Unable to get u/s due to time of day, but still patient seems low risk. Will give lovenox and get u/s tomorrow and will follow up with PCP.         Pricilla Loveless, MD 06/17/12 2015

## 2012-06-17 NOTE — ED Notes (Signed)
CT tech taking pt now to CT via wheelchair

## 2012-06-17 NOTE — ED Notes (Signed)
Pt discharged with written instructions but signature pad would not work.  Pt given instructed to return tomorrow am at 0800 to admitting office for venous doppler study.

## 2012-06-17 NOTE — ED Notes (Signed)
Pt her from [facility], sts recent d&c, now since Monday, had pressure in chest and difficulty breathing,

## 2012-06-17 NOTE — ED Notes (Signed)
Had general anesthesia 8-21 for D&C , and state she has had constant pain in chest since then; hist of tarsal tunnel surgery , and both legs hurt, left >right; NAD at present

## 2012-06-17 NOTE — ED Provider Notes (Signed)
I saw and evaluated the patient, reviewed the resident's note and I agree with the findings and plan.  Agree with EKG interpretation. CP, SOB ongoing for several days. Also has leg aching and ?swelling. CTA neg, schedule for outpatient DVT.   Charles B. Bernette Mayers, MD 06/17/12 2055

## 2012-06-17 NOTE — ED Notes (Signed)
Pt to CT

## 2012-06-17 NOTE — ED Provider Notes (Signed)
History     CSN: [id number]  Arrival date & time 06/17/12  1108   None     Chief Complaint  Patient presents with  . Shortness of Breath    (Consider location/radiation/quality/duration/timing/severity/associated sxs/prior treatment) Patient is a 61 y.o. female presenting with chest pain. The history is provided by the patient.  Chest Pain The chest pain began 3 - 5 days ago. Chest pain occurs intermittently. The chest pain is unchanged. The severity of the pain is mild. The quality of the pain is described as pressure-like. The pain radiates to the mid back. Chest pain is worsened by certain positions. Primary symptoms include shortness of breath. Pertinent negatives for primary symptoms include no cough and no palpitations. She tried nothing for the symptoms. Risk factors include no known risk factors.  Her past medical history is significant for hypertension.  Her family medical history is significant for heart disease in family.   Pt had D&C for uterine polyps on 6 days ago.  Since then she has had bilateral leg cramping and substernal chest pain radiating to mid back.  Pain is worse in the morning associated with sob.   Past Medical History  Diagnosis Date  . GERD (gastroesophageal reflux disease)   . PONV (postoperative nausea and vomiting)   . HTN (hypertension)     no meds required per Dr Alwyn Ren  . SVD (spontaneous vaginal delivery)     x 1    Past Surgical History  Procedure Date  . Total knee arthroplasty '04 / '05    x2  . Ankle surgery Nov 2012  . Knee surgery     x7  . Wrist surgery     x2  . Tonsillectomy   . Wisdom tooth extraction   . Eye surgery     bilateral lasik eye surgery  . Dilation and curettage of uterus   . Tarsal tunnel release     Family History  Problem Relation Age of Onset  . Hypertension      History  Substance Use Topics  . Smoking status: Former Smoker -- 1.0 packs/day for 10 years    Types: Cigarettes  . Smokeless tobacco:  Never Used   Comment: Quit 13 years ago as of 2012  . Alcohol Use: Yes     rare    OB History    Grav Para Term Preterm Abortions TAB SAB Ect Mult Living                  Review of Systems  Constitutional: Negative.   Respiratory: Positive for chest tightness and shortness of breath. Negative for cough.   Cardiovascular: Positive for chest pain and leg swelling. Negative for palpitations.  Gastrointestinal: Negative.   Musculoskeletal: Negative.     Allergies  Review of patient's allergies indicates no known allergies.  Home Medications   Current Outpatient Rx  Name Route Sig Dispense Refill  . CALCIUM 600 PO Oral Take 1 tablet by mouth at bedtime.     Marland Kitchen VITAMIN D3 1000 UNITS PO CAPS Oral Take 2 capsules by mouth at bedtime.     . FUROSEMIDE 40 MG PO TABS Oral Take 40 mg by mouth. 3 x weekly      . MELOXICAM 7.5 MG PO TABS Oral Take 7.5 mg by mouth daily.    Marland Kitchen ONE-DAILY MULTI VITAMINS PO TABS Oral Take 1 tablet by mouth daily.      Marland Kitchen OMEPRAZOLE-SODIUM BICARBONATE 40-1100 MG PO CAPS Oral Take 1 capsule  by mouth at bedtime.     Marland Kitchen POTASSIUM CHLORIDE CRYS ER 20 MEQ PO TBCR Oral Take 20 mEq by mouth. 3 x weekly with fluid pill       BP 170/98  Pulse 69  Temp 98.5 F (36.9 C) (Oral)  SpO2 98%  Physical Exam  Nursing note and vitals reviewed. Constitutional: She is oriented to person, place, and time. Vital signs are normal. She appears well-developed and well-nourished. She is active and cooperative.  HENT:  Head: Normocephalic.  Eyes: Conjunctivae are normal. Pupils are equal, round, and reactive to light. No scleral icterus.  Neck: Trachea normal. Neck supple.  Cardiovascular: Normal rate and regular rhythm.   Pulmonary/Chest: Effort normal and breath sounds normal.  Neurological: She is alert and oriented to person, place, and time. No cranial nerve deficit or sensory deficit.  Skin: Skin is warm and dry.  Psychiatric: She has a normal mood and affect. Her speech is  normal and behavior is normal. Judgment and thought content normal. Cognition and memory are normal.    ED Course  Procedures (including critical care time)  Labs Reviewed - No data to display No results found.   1. Chest pain       MDM  Transfer to [facility] for further evaluation of chest pain, sob and leg swelling.       Johnsie Kindred, NP 06/17/12 1159

## 2012-06-17 NOTE — ED Provider Notes (Signed)
Medical screening examination/treatment/procedure(s) were performed by non-physician practitioner and as supervising physician I was immediately available for consultation/collaboration. I discussed this case personally with Porfirio Mylar and concur with her plan to transfer this patient with chest pain shortness of breath to the emergency department for further evaluation.  Leslee Home, M.D.   Reuben Likes, MD 06/17/12 2240

## 2012-06-18 ENCOUNTER — Ambulatory Visit (HOSPITAL_COMMUNITY)
Admission: RE | Admit: 2012-06-18 | Discharge: 2012-06-18 | Disposition: A | Discharge status: Home / Self Care | Payer: BC Managed Care – PPO | Source: Ambulatory Visit | Attending: Emergency Medicine | Admitting: Emergency Medicine

## 2012-06-18 DIAGNOSIS — M79609 Pain in unspecified limb: Secondary | ICD-10-CM

## 2012-06-18 NOTE — Progress Notes (Signed)
Bilateral:  No evidence of DVT, superficial thrombosis, or Baker's Cyst.   

## 2012-06-25 ENCOUNTER — Encounter: Payer: Self-pay | Admitting: Internal Medicine

## 2012-07-25 ENCOUNTER — Other Ambulatory Visit: Payer: Self-pay

## 2012-07-25 NOTE — Telephone Encounter (Signed)
Refill # 30; last appt 7/12. Please  schedule F/U before any refill

## 2012-07-25 NOTE — Telephone Encounter (Signed)
Pt last OV 7/12. Did not see that we ever filled Rx.    Plz advise         MW

## 2012-07-28 MED ORDER — OMEPRAZOLE-SODIUM BICARBONATE 40-1100 MG PO CAPS: 1.0000 | ORAL_CAPSULE | Freq: Every day | ORAL | Status: DC

## 2012-07-28 NOTE — Telephone Encounter (Signed)
Spoke to pt advising Rx approved for 30 days, verified pharmacy, and scheduled f/u appt per dr. Alwyn Ren.     MW

## 2012-08-01 ENCOUNTER — Ambulatory Visit (INDEPENDENT_AMBULATORY_CARE_PROVIDER_SITE_OTHER): Payer: BC Managed Care – PPO | Admitting: Internal Medicine

## 2012-08-01 ENCOUNTER — Encounter: Payer: Self-pay | Admitting: Internal Medicine

## 2012-08-01 VITALS — BP 144/90 | HR 88 | Temp 98.1°F | Resp 20 | Wt 265.0 lb

## 2012-08-01 DIAGNOSIS — Z23 Encounter for immunization: Secondary | ICD-10-CM

## 2012-08-01 DIAGNOSIS — E8881 Metabolic syndrome: Secondary | ICD-10-CM

## 2012-08-01 DIAGNOSIS — R609 Edema, unspecified: Secondary | ICD-10-CM

## 2012-08-01 DIAGNOSIS — K219 Gastro-esophageal reflux disease without esophagitis: Secondary | ICD-10-CM

## 2012-08-01 DIAGNOSIS — I10 Essential (primary) hypertension: Secondary | ICD-10-CM

## 2012-08-01 MED ORDER — LOSARTAN POTASSIUM 100 MG PO TABS: 100.0000 mg | ORAL_TABLET | Freq: Every day | ORAL | Status: DC

## 2012-08-01 MED ORDER — OMEPRAZOLE-SODIUM BICARBONATE 40-1100 MG PO CAPS: 1.0000 | ORAL_CAPSULE | Freq: Every day | ORAL | Status: DC

## 2012-08-01 MED ORDER — FUROSEMIDE 40 MG PO TABS: 40.0000 mg | ORAL_TABLET | ORAL | Status: DC

## 2012-08-01 MED ORDER — ASPIRIN 81 MG PO TBEC: 81.0000 mg | DELAYED_RELEASE_TABLET | Freq: Every day | ORAL | Status: DC

## 2012-08-01 NOTE — Patient Instructions (Addendum)
Please  schedule fasting Labs : A1c,Lipids, hepatic panel,  TSH. PLEASE BRING THESE INSTRUCTIONS TO FOLLOW UP  LAB APPOINTMENT.This will guarantee correct labs are drawn, eliminating need for repeat blood sampling ( needle sticks ! ). Diagnoses /Codes: 277.7,790.29 Blood Pressure Goal  Ideally is an AVERAGE < 135/85. This AVERAGE should be calculated from @ least 5-7 BP readings taken @ different times of day on different days of week. You should not respond to isolated BP readings , but rather the AVERAGE for that week.  Please take enteric-coated aspirin 81 mg daily with breakfast.   Eat a low-fat diet with lots of fruits and vegetables, up to 7-9 servings per day. Consume less than 30 (preferably ZERO) grams of sugar per day from foods & drinks with High Fructose Corn Syrup (HFCS) sugar as #1,2,3 or # 4 on label.Whole Foods, [employer] & [employer] do not carry products with HFCS. Follow a  low carb nutrition program such as [place] or [employer]  to prevent Diabetes progression . White carbohydrates (potatoes, rice, bread, and pasta) have a high spike of sugar and a high load of sugar. For example a  baked potato has a cup of sugar and a  french fry  2 teaspoons of sugar. Yams, wild  rice, whole grained bread &  wheat pasta have been much lower spike and load of  sugar. Portions should be the size of a deck of cards or your palm.  Call with Cardiologist's name

## 2012-08-01 NOTE — Assessment & Plan Note (Signed)
She is asymptomatic on present regimen; no change. Her  recent CBC and differential revealed no anemia

## 2012-08-01 NOTE — Progress Notes (Signed)
  Subjective:    Patient ID: Sandra Stevens, female    DOB: May 19, 1951, 61 y.o.   MRN: [id number]  HPI She is here for refills of medicines for reflux and edema.  She recently had a gynecologic procedure; following that she did have leg cramps and atypical chest discomfort. Emergency room records 06/17/12 were reviewed.  Her renal function was mildly reduced based on a GFR of 75. Glucose was 106. Troponin was negative; BNP was normal. D-dimer was elevated; but the CT angiogram revealed no pulmonary emboli and venous Doppler revealed no deep venous thrombosis.  Both the CT angiogram and chest x-ray suggests atherosclerosis.  There is no record of lipids on the chart    Review of Systems She is not monitoring her blood pressure regularly. She does have intermittent edema. She denies chest pain, palpitations, or claudication.She describes herself as generally " healthy"  She denies hoarseness, dysphagia, abdominal pain, rectal bleeding, or melena.     Objective:   Physical Exam General appearance : good nourishment w/o distress; weight excess.  Eyes: No conjunctival inflammation or scleral icterus is present.  Neck: thyroid normal  Oral exam: Dental hygiene is good; lips and gums are healthy appearing.There is no oropharyngeal erythema or exudate noted.   Heart:  Normal rate and regular rhythm. S1 and S2 normal without gallop, murmur, click, rub or other extra sounds     Lungs:Chest clear to auscultation; no wheezes, rhonchi,rales ,or rubs present.No increased work of breathing.   Abdomen: bowel sounds normal, soft and non-tender without masses, organomegaly or hernias noted.  No guarding or rebound   Skin:Warm & dry.  Intact without suspicious lesions or rashes ; no jaundice or tenting  Lymphatic: No lymphadenopathy is noted about the head, neck, axilla areas.   Neuro: DTRs WNL; alert & oriented X 3             Assessment & Plan:

## 2012-08-01 NOTE — Assessment & Plan Note (Signed)
Blood pressure goals discussed; low dose ARB recommended with monitor

## 2012-08-04 ENCOUNTER — Encounter: Payer: Self-pay | Admitting: Internal Medicine

## 2012-08-04 ENCOUNTER — Other Ambulatory Visit: Payer: Self-pay | Admitting: Internal Medicine

## 2012-08-04 ENCOUNTER — Other Ambulatory Visit (INDEPENDENT_AMBULATORY_CARE_PROVIDER_SITE_OTHER): Payer: BC Managed Care – PPO

## 2012-08-04 ENCOUNTER — Other Ambulatory Visit: Payer: Self-pay

## 2012-08-04 DIAGNOSIS — E8881 Metabolic syndrome: Secondary | ICD-10-CM

## 2012-08-04 DIAGNOSIS — R7309 Other abnormal glucose: Secondary | ICD-10-CM

## 2012-08-04 LAB — LIPID PANEL
HDL: 49 mg/dL (ref >39.00)
LDL Cholesterol: 96 mg/dL (ref 0–99)
Total CHOL/HDL Ratio: 3

## 2012-08-04 LAB — HEPATIC FUNCTION PANEL
AST: 26 U/L (ref 0–37)
Albumin: 3.6 g/dL (ref 3.5–5.2)
Alkaline Phosphatase: 58 U/L (ref 39–117)
Total Bilirubin: 1.3 mg/dL — ABNORMAL HIGH (ref 0.3–1.2)

## 2012-08-04 LAB — TSH: TSH: 2.15 u[IU]/mL (ref 0.35–5.50)

## 2012-08-04 MED ORDER — POTASSIUM CHLORIDE CRYS ER 20 MEQ PO TBCR: 20.0000 meq | EXTENDED_RELEASE_TABLET | ORAL | Status: DC

## 2012-08-04 NOTE — Telephone Encounter (Signed)
See mychart message. I contacted patient to verify if she would like it sent to [facility], patient responded [facility]

## 2012-08-05 ENCOUNTER — Other Ambulatory Visit: Payer: Self-pay | Admitting: Internal Medicine

## 2012-08-05 DIAGNOSIS — I2510 Atherosclerotic heart disease of native coronary artery without angina pectoris: Secondary | ICD-10-CM

## 2012-08-05 DIAGNOSIS — E8881 Metabolic syndrome: Secondary | ICD-10-CM

## 2012-08-06 ENCOUNTER — Ambulatory Visit: Payer: BC Managed Care – PPO | Admitting: Internal Medicine

## 2012-08-18 ENCOUNTER — Encounter: Payer: Self-pay | Admitting: Internal Medicine

## 2012-08-26 ENCOUNTER — Encounter: Payer: Self-pay | Admitting: Internal Medicine

## 2012-08-26 NOTE — Telephone Encounter (Signed)
Great BP; goal = AVERAGE < 135/85. No change !

## 2012-08-29 ENCOUNTER — Encounter: Payer: Self-pay | Admitting: Cardiology

## 2012-08-29 ENCOUNTER — Ambulatory Visit (INDEPENDENT_AMBULATORY_CARE_PROVIDER_SITE_OTHER): Payer: BC Managed Care – PPO | Admitting: Cardiology

## 2012-08-29 VITALS — BP 130/80 | HR 99 | Ht 66.0 in | Wt 257.8 lb

## 2012-08-29 DIAGNOSIS — I10 Essential (primary) hypertension: Secondary | ICD-10-CM

## 2012-08-29 DIAGNOSIS — I2510 Atherosclerotic heart disease of native coronary artery without angina pectoris: Secondary | ICD-10-CM

## 2012-08-29 DIAGNOSIS — I2584 Coronary atherosclerosis due to calcified coronary lesion: Secondary | ICD-10-CM

## 2012-08-29 DIAGNOSIS — R079 Chest pain, unspecified: Secondary | ICD-10-CM | POA: Insufficient documentation

## 2012-08-29 MED ORDER — LOSARTAN POTASSIUM 100 MG PO TABS: 100.0000 mg | ORAL_TABLET | Freq: Every day | ORAL | Status: DC

## 2012-08-29 NOTE — Patient Instructions (Signed)
We will schedule you for a nuclear stress test   

## 2012-08-29 NOTE — Progress Notes (Signed)
Sandra Stevens Date of Birth: 1951-06-28 Medical Record #[id number]  History of Present Illness: Mrs. Bolus is a pleasant 61 year old white female seen at the request of Dr. Alwyn Ren for evaluation of chest pain. She has a history of hypertension and morbid obesity. She is a former smoker. At the end of August of this year she developed significant substernal chest pain. She states that this lasted several days. She describes as a mid substernal chest pressure without radiation. It was associated with shortness of breath when she would go up stairs. She presented to the emergency room where blood work including cardiac enzymes and BNP were negative. ECG showed no acute changes. A CT of the chest was negative for pulmonary emboli but did show coronary calcification. Since that time she has had some minor symptoms of chest pressure. She does walk 2-1/2 miles 5 days a week and other than mild shortness of breath she does well with this. She reports that she had a stress test several years ago which apparently was okay.  Current Outpatient Prescriptions on File Prior to Visit  Medication Sig Dispense Refill  . aspirin 81 MG EC tablet Take 1 tablet (81 mg total) by mouth daily. Swallow whole.  30 tablet  12  . Calcium Carbonate (CALCIUM 600 PO) Take 1 tablet by mouth at bedtime.       . Cholecalciferol (VITAMIN D3) 1000 UNITS CAPS Take 2 capsules by mouth at bedtime.       . furosemide (LASIX) 40 MG tablet Take 1 tablet (40 mg total) by mouth 3 (three) times a week.  90 tablet  0  . Multiple Vitamin (MULTIVITAMIN) tablet Take 1 tablet by mouth daily.        Marland Kitchen omeprazole-sodium bicarbonate (ZEGERID) 40-1100 MG per capsule Take 1 capsule by mouth at bedtime.  90 capsule  3  . potassium chloride SA (K-DUR,KLOR-CON) 20 MEQ tablet Take 1 tablet (20 mEq total) by mouth 3 (three) times a week. Take along with Furosemide.  45 tablet  2  . [DISCONTINUED] losartan (COZAAR) 100 MG tablet Take 1 tablet (100 mg  total) by mouth daily.  90 tablet  1    No Known Allergies  Past Medical History  Diagnosis Date  . GERD (gastroesophageal reflux disease)   . PONV (postoperative nausea and vomiting)   . HTN (hypertension)     no meds required per Dr Alwyn Ren  . SVD (spontaneous vaginal delivery)     x 1  . Coronary artery calcification     Past Surgical History  Procedure Date  . Total knee arthroplasty '04 / '05    x2  . Ankle surgery Nov 2012  . Knee surgery     x7  . Wrist surgery     x2  . Tonsillectomy   . Wisdom tooth extraction   . Eye surgery     bilateral lasik eye surgery  . Dilation and curettage of uterus   . Tarsal tunnel release   . Endometrial septum 06/11/12    Dr Arelia Sneddon  . Colonoscopy w/ polypectomy     done every 5 years due to FH colon cancer  . Colonoscopy 2010    Dr Marina Goodell    History  Smoking status  . Former Smoker -- 1.0 packs/day for 10 years  . Types: Cigarettes  . Quit date: 10/22/1998  Smokeless tobacco  . Never Used    Comment: Quit 13 years ago as of 2012    History  Alcohol  Use  . Yes    Comment: rarely    Family History  Problem Relation Age of Onset  . Hypertension    . Stroke Neg Hx   . Heart disease Neg Hx   . Diabetes Neg Hx     Review of Systems: The review of systems is positive for obesity.  All other systems were reviewed and are negative.  Physical Exam: BP 130/80  Pulse 99  Ht 5\' 6"  (1.676 m)  Wt 257 lb 12.8 oz (116.937 kg)  BMI 41.61 kg/m2  SpO2 71% She is a morbidly obese white female in no acute distress.The patient is alert and oriented x 3.  The mood and affect are normal.  The skin is warm and dry.  Color is normal.  The HEENT exam reveals that the sclera are nonicteric.  The mucous membranes are moist.  The carotids are 2+ without bruits.  There is no thyromegaly.  There is no JVD.  The lungs are clear.  The chest wall is non tender.  The heart exam reveals a regular rate with a normal S1 and S2.  There are no  murmurs, gallops, or rubs.  The PMI is not displaced.   Abdominal exam reveals good bowel sounds.  There is no guarding or rebound.  There is no hepatosplenomegaly or tenderness.  There are no masses.  Exam of the legs reveal no clubbing, cyanosis, or edema.  The legs are without rashes.  The distal pulses are intact.  Cranial nerves II - XII are intact.  Motor and sensory functions are intact.  The gait is normal.  LABORATORY DATA: ECG today demonstrates normal sinus rhythm. Has a left anterior fascicular block. She has low voltage with poor R wave progression across the precordium. I reviewed her prior chest CT. She does have significant calcification involving the left main and proximal to mid LAD. There is also mild calcification in the mid RCA.  Assessment / Plan: 1. Chest pain with atypical features.  2. Coronary calcification. Given these findings and her recent chest pain symptoms I recommended a stress Myoview study to stratify her cardiac risk. If negative then I would recommend risk factor modification. If abnormal would need to consider for cardiac catheterization.  3. Morbid obesity.  4. Hypertension, now on losartan.

## 2012-09-04 ENCOUNTER — Encounter: Payer: Self-pay | Admitting: Internal Medicine

## 2012-09-09 ENCOUNTER — Ambulatory Visit (HOSPITAL_COMMUNITY): Payer: BC Managed Care – PPO | Attending: Cardiology | Admitting: Radiology

## 2012-09-09 VITALS — BP 130/77 | Ht 66.0 in | Wt 257.0 lb

## 2012-09-09 DIAGNOSIS — I2510 Atherosclerotic heart disease of native coronary artery without angina pectoris: Secondary | ICD-10-CM

## 2012-09-09 DIAGNOSIS — R0609 Other forms of dyspnea: Secondary | ICD-10-CM

## 2012-09-09 DIAGNOSIS — I10 Essential (primary) hypertension: Secondary | ICD-10-CM | POA: Insufficient documentation

## 2012-09-09 DIAGNOSIS — R0989 Other specified symptoms and signs involving the circulatory and respiratory systems: Secondary | ICD-10-CM

## 2012-09-09 DIAGNOSIS — R079 Chest pain, unspecified: Secondary | ICD-10-CM | POA: Insufficient documentation

## 2012-09-09 MED ORDER — TECHNETIUM TC 99M SESTAMIBI GENERIC - CARDIOLITE
30.0000 | Freq: Once | INTRAVENOUS | Status: AC | PRN
  Administered 2012-09-09: 30 via INTRAVENOUS

## 2012-09-09 NOTE — Progress Notes (Signed)
[facility] SITE 3 NUCLEAR MED [id number] [place] [address] [phone]  Cardiology Nuclear Med Study  Sandra Stevens is a 61 y.o. female     MRN : [id number]     DOB: [birth year]  Procedure Date: 09/09/2012  Nuclear Med Background Indication for Stress Test:  Evaluation for Ischemia History:  05/2012 CT: coronary calcification significant (L) main mild mid RCA, Cardiac Risk Factors: History of Smoking, Hypertension and Obesity  Symptoms:  Chest Pressure and DOE   Nuclear Pre-Procedure Caffeine/Decaff Intake:  None > 12 hrs NPO After: 8:30pm   Lungs:  clear O2 Sat: 98% on room air. IV 0.9% NS with Angio Cath:  22g  IV Site: R Hand x 1, tolerated well IV Started by:  Irean Hong, RN  Chest Size (in):  44 Cup Size: DDD  Height: 5\' 6"  (1.676 m)  Weight:  257 lb (116.574 kg)  BMI:  Body mass index is 41.48 kg/(m^2). Tech Comments:  Took losartan this am    Nuclear Med Study 1 or 2 day study: 2 day  Stress Test Type:  Stress  Reading MD: Dietrich Pates MD  Order Authorizing Provider:  Peter Swazil[place] , MD  Resting Radionuclide: Technetium 28m Sestamibi  Resting Radionuclide Dose: 32.4 mCi  09/11/12  Stress Radionuclide:  Technetium 12m Sestamibi  Stress Radionuclide Dose: 33.0 mCi  09/09/12          Stress Protocol Rest HR: 65 Stress HR: 148  Rest BP: 130/77 Stress BP: 204/105  Exercise Time (min): 6:04 METS: 7.0   Predicted Max HR: 157 bpm % Max HR: 94.27 bpm Rate Pressure Product: 62952   Dose of Adenosine (mg):  n/a Dose of Lexiscan: n/a mg  Dose of Atropine (mg): n/a Dose of Dobutamine: n/a mcg/kg/min (at max HR)  Stress Test Technologist: Milana Na, EMT-P  Nuclear Technologist:  Domenic Polite, CNMT     Rest Procedure:  Myocardial perfusion imaging was performed at rest 45 minutes following the intravenous administration of Technetium 34m Sestamibi. Rest ECG: NSR - Normal EKG  Stress Procedure:  The patient performed  treadmill exercise using a Bruce  Protocol for 6:04 minutes. The patient stopped due to fatigue and denied any chest pain.  There were no significant ST-T wave changes and rare pvcs.  Technetium 33m Sestamibi was injected at peak exercise and myocardial perfusion imaging was performed after a brief delay. Stress ECG: No significant change from baseline ECG  QPS Raw Data Images:  Soft tissue (breast, diaphragm) surround heart. Stress Images:  Minimal thinning in the mid inferior wall and apex.  Otherwise normal perfusion. Rest Images:  Normal perfusion. Subtraction (SDS):  No evidence of ischemia. Transient Ischemic Dilatation (Normal <1.22):  0.95 Lung/Heart Ratio (Normal <0.45):  0.31  Quantitative Gated Spect Images QGS EDV:  82 ml QGS ESV:  23 ml  Impression Exercise Capacity:  Fair exercise capacity. BP Response:  Normal blood pressure response. Clinical Symptoms:  No chest pain. ECG Impression:  No significant ST segment change suggestive of ischemia. Comparison with Prior Nuclear Study:No previous report.  Overall Impression:  Probable normal perfusion and minimal soft tissue attenuation (diaphragm, breast)  No signif ischemia or scar. LV Ejection Fraction: 72%.  LV Wall Motion:  NL LV Function; NL Wall Motion

## 2012-09-11 ENCOUNTER — Ambulatory Visit (HOSPITAL_COMMUNITY): Payer: BC Managed Care – PPO | Admitting: Radiology

## 2012-09-11 DIAGNOSIS — R0989 Other specified symptoms and signs involving the circulatory and respiratory systems: Secondary | ICD-10-CM

## 2012-09-11 MED ORDER — TECHNETIUM TC 99M SESTAMIBI GENERIC - CARDIOLITE
33.0000 | Freq: Once | INTRAVENOUS | Status: AC | PRN
  Administered 2012-09-11: 33 via INTRAVENOUS

## 2012-11-26 ENCOUNTER — Other Ambulatory Visit: Payer: Self-pay | Admitting: Internal Medicine

## 2012-12-11 ENCOUNTER — Encounter: Payer: Self-pay | Admitting: Cardiology

## 2012-12-16 ENCOUNTER — Encounter: Payer: Self-pay | Admitting: Internal Medicine

## 2013-01-26 ENCOUNTER — Other Ambulatory Visit (HOSPITAL_COMMUNITY): Payer: Self-pay | Admitting: Obstetrics and Gynecology

## 2013-01-26 DIAGNOSIS — Z1231 Encounter for screening mammogram for malignant neoplasm of breast: Secondary | ICD-10-CM

## 2013-03-03 ENCOUNTER — Ambulatory Visit (HOSPITAL_COMMUNITY): Payer: BC Managed Care – PPO

## 2013-03-03 ENCOUNTER — Ambulatory Visit (HOSPITAL_COMMUNITY)
Admission: RE | Admit: 2013-03-03 | Discharge: 2013-03-03 | Disposition: A | Discharge status: Home / Self Care | Payer: BC Managed Care – PPO | Source: Ambulatory Visit | Attending: Obstetrics and Gynecology | Admitting: Obstetrics and Gynecology

## 2013-03-03 DIAGNOSIS — Z1231 Encounter for screening mammogram for malignant neoplasm of breast: Secondary | ICD-10-CM | POA: Insufficient documentation

## 2013-04-17 ENCOUNTER — Other Ambulatory Visit: Payer: Self-pay | Admitting: Internal Medicine

## 2013-07-16 ENCOUNTER — Other Ambulatory Visit: Payer: Self-pay | Admitting: Internal Medicine

## 2013-07-17 NOTE — Telephone Encounter (Signed)
Med filled 1 month with 0 refills pt due for OV and labs.

## 2013-08-03 ENCOUNTER — Encounter: Payer: Self-pay | Admitting: Internal Medicine

## 2013-08-04 NOTE — Telephone Encounter (Signed)
Flu shot was recorded.//AB/CMA

## 2013-09-28 ENCOUNTER — Telehealth: Payer: Self-pay | Admitting: Neurology

## 2013-09-28 ENCOUNTER — Ambulatory Visit (INDEPENDENT_AMBULATORY_CARE_PROVIDER_SITE_OTHER): Payer: BC Managed Care – PPO | Admitting: Neurology

## 2013-09-28 ENCOUNTER — Encounter: Payer: Self-pay | Admitting: Neurology

## 2013-09-28 VITALS — BP 148/89 | HR 68 | Resp 18 | Ht 66.0 in | Wt 253.0 lb

## 2013-09-28 DIAGNOSIS — J309 Allergic rhinitis, unspecified: Secondary | ICD-10-CM

## 2013-09-28 DIAGNOSIS — G4733 Obstructive sleep apnea (adult) (pediatric): Secondary | ICD-10-CM

## 2013-09-28 DIAGNOSIS — Z9989 Dependence on other enabling machines and devices: Secondary | ICD-10-CM

## 2013-09-28 MED ORDER — MOMETASONE FUROATE 50 MCG/ACT NA SUSP: 2.0000 | Freq: Every day | NASAL | Status: DC

## 2013-09-28 NOTE — Progress Notes (Signed)
[facility]  Provider:  Melvyn Novas, M D  Referring Provider: Pecola Lawless, MD Primary Care Physician:  Marga Melnick, MD  Chief Complaint  Patient presents with  . Sleep Consult    Re-est. Pt care    HPI:  Sandra Stevens is a 62 y.o. female  Is seen here as a referral   from Dr. Alwyn Ren for  CPAP evaluation, OSA .   Sandra Stevens was last seen in our office over 4 years ago or so she is concerned today a new and get established patient. She has been followed by Dr. Alwyn Ren for her primary care needs for decades female. She was diagnosed with sleep apnea and a sleep study at [facility] on 01-12-09 with an AHI of 14.2. The patient has been using CPAP set at 7 cm water with an EPR of 2.  For the last 4 years her average time on CPAP  therapy  was 4 hours and 79 minutes,  the residual AHI was 1.1.  She is followed by [facility] and she had followup compliance visit throughout the year 2010.  She does get her replacement parents still throughout [facility]  I was able today to obtain an in office download on 09-28-13 is the residual AHI of 1.7 and an average daytime and therapy of 7 hours 37 minutes,  there is a mild air leak and for the last 68 days 100% compliance over 4 hours or more at night.  CPAP was still set at 7 cm water with 2 cm EPI and the patient is using a nasal pillow mask. She endorsed today the Epworth sleepiness score at only 4 points which has been the same as on 2010 after CPAP was first initiated, her fatigue severity score is 25 points  (below average).    Sandra Stevens is sleep habits are as follows. She is now retired. She will go to vaginal between midnight and 1 AM and can afford to sleep until 8 or 9 AM, one nocturia break, sleeps through, no reported snoring break through snoring or apnea, no sleep walking- talking.  Some times wakes with a dry mouth, when her nose is stuffy. She has breakfast with 2 cups of coffee, exercises ( walking for one  hour), no naps in daytime. Her bedroom is cool , quiet and dark. She cannot sleep without CPAP anymore.  No sodas or tea in daytime, non smoker , non drinker.   No known family history of sleep disorders, but her mother had insomnia and snoring.  Patient was not excessively sleepy during her childhood or school year is. Her apnea came with her 50's and with weight gain.     PLAN : Her humidifier needs to be adjusted , she would like nasonex and a chin strip. [facility]>     Review of Systems: Out of a complete 14 system review, the patient complains of only the following symptoms, and all other reviewed systems are negative. epworth 4, FSS 24 . Weight gain over 4 years 30 pounds. Lost already 20 pounds since  February 2014.   History   Social History  . Marital Status: Married    Spouse Name: N/A    Number of Children: 2  . Years of Education: N/A   Occupational History  . retired-IT    Social History Main Topics  . Smoking status: Former Smoker -- 1.00 packs/day for 10 years    Types: Cigarettes    Quit date: 10/22/1998  .  Smokeless tobacco: Never Used     Comment: Quit 13 years ago as of 2012  . Alcohol Use: Yes     Comment: rarely  . Drug Use: No  . Sexual Activity: Yes    Birth Control/ Protection: None   Other Topics Concern  . Not on file   Social History Narrative  . No narrative on file    Family History  Problem Relation Age of Onset  . Hypertension    . Stroke Neg Hx   . Heart disease Neg Hx   . Diabetes Neg Hx     Past Medical History  Diagnosis Date  . GERD (gastroesophageal reflux disease)   . PONV (postoperative nausea and vomiting)   . HTN (hypertension)     no meds required per Dr Alwyn Ren  . SVD (spontaneous vaginal delivery)     x 1  . Coronary artery calcification   . OSA on CPAP     mild OSA, CPAP of 7 cm since 2009?     Past Surgical History  Procedure Laterality Date  . Total knee arthroplasty  '04 / '05    x2  . Ankle surgery  Nov  2012  . Knee surgery      x7  . Wrist surgery      x2  . Tonsillectomy    . Wisdom tooth extraction    . Eye surgery      bilateral lasik eye surgery  . Dilation and curettage of uterus    . Tarsal tunnel release    . Endometrial septum  06/11/12    Dr Arelia Sneddon  . Colonoscopy w/ polypectomy      done every 5 years due to FH colon cancer  . Colonoscopy  2010    Dr Marina Goodell    Current Outpatient Prescriptions  Medication Sig Dispense Refill  . aspirin 81 MG EC tablet Take 1 tablet (81 mg total) by mouth daily. Swallow whole.  30 tablet  12  . Calcium Carbonate (CALCIUM 600 PO) Take 1 tablet by mouth at bedtime.       . Cholecalciferol (VITAMIN D3) 1000 UNITS CAPS Take 2 capsules by mouth at bedtime.       . furosemide (LASIX) 40 MG tablet TAKE 1 TABLET THREE TIMES  WEEKLY  39 tablet  1  . losartan (COZAAR) 100 MG tablet TAKE 1 TABLET DAILY  90 tablet  3  . Multiple Vitamin (MULTIVITAMIN) tablet Take 1 tablet by mouth daily.        Marland Kitchen omeprazole-sodium bicarbonate (ZEGERID) 40-1100 MG per capsule TAKE 1 CAPSULE AT BEDTIME  90 capsule  0  . potassium chloride SA (K-DUR,KLOR-CON) 20 MEQ tablet Take 1 tablet (20 mEq total) by mouth 3 (three) times a week. Take along with Furosemide.  45 tablet  2  . zolpidem (AMBIEN CR) 12.5 MG CR tablet       . FLUARIX QUADRIVALENT 0.5 ML injection       . VOLTAREN 1 % GEL        No current facility-administered medications for this visit.    Allergies as of 09/28/2013  . (No Known Allergies)    Vitals: BP 148/89  Pulse 68  Resp 18  Ht 5\' 6"  (1.676 m)  Wt 253 lb (114.76 kg)  BMI 40.85 kg/m2 Last Weight:  Wt Readings from Last 1 Encounters:  09/28/13 253 lb (114.76 kg)   Last Height:   Ht Readings from Last 1 Encounters:  09/28/13 5\' 6"  (1.676  m)    Physical exam:  General: The patient is awake, alert and appears not in acute distress. The patient is well groomed. Head: Normocephalic, atraumatic. Neck is supple. Mallampati 3, neck  circumference: 13.25 . Large chest , TMJ clicking - audible !!! Cardiovascular:  Regular rate and rhythm, without  murmurs or carotid bruit, and without distended neck veins. Respiratory: Lungs are clear to auscultation. Skin:  Without evidence of edema, or rash Trunk: BMI is elevated, but  patient  has normal posture.  Neurologic exam : The patient is awake and alert, oriented to place and time.  Memory subjective described as intact. There is a normal attention span & concentration ability. Speech is fluent without  dysarthria, dysphonia or aphasia.  Mood and affect are appropriate.  Cranial nerves: Pupils are equal and briskly reactive to light. Funduscopic exam without  evidence of pallor or edema.  Extraocular movements  in vertical and horizontal planes intact and without nystagmus. Visual fields by finger perimetry are intact. Hearing to finger rub intact.  Facial sensation intact to fine touch. Facial motor strength is symmetric and tongue and uvula move midline.  Motor exam:   Normal tone and normal muscle bulk and symmetric normal strength in all extremities.  Sensory:  Fine touch, pinprick and vibration were tested in all extremities.  Proprioception is tested in the upper extremities only. This was  normal.  Coordination: Rapid alternating movements in the fingers/hands is tested and normal.  Finger-to-nose maneuver tested and normal without evidence of ataxia, dysmetria or tremor.  Gait and station: Patient walks without assistive device .  Strength within normal limits. Stance is stable and normal.  Romberg testing normal. Patient used a cane before her knee replacements 7 surgeries. 8 and 9 years ago.   Deep tendon reflexes: in the  upper and lower extremities are symmetric and intact. Babinski maneuver response is   downgoing.   Assessment:  After physical and neurologic examination, review of laboratory studies, imaging, neurophysiology testing and pre-existing  records, assessment is  Obesity with osteoarthritis,  OSA GERD  TMJ  Plan:  Treatment plan and additional workup : Patient will continue on CPAP at 7 cm water  with 2 cm EPR, she has excellent 100% compliance for the last 3 month , she needs no adjustments, only to the humidifier .  Nasal pillow and nasal mask  replacement.  Chin strap. Nasonex.

## 2013-09-28 NOTE — Patient Instructions (Signed)
CPAP and BIPAP Information CPAP and BIPAP are methods of helping you breathe with the use of air pressure. CPAP stands for "continuous positive airway pressure." BIPAP stands for "bi-level positive airway pressure." In both methods, air is blown into your air passages to help keep you breathing well. With CPAP, the amount of pressure stays the same while you breathe in and out. CPAP is most commonly used for obstructive sleep apnea. For obstructive sleep apnea, CPAP works by holding your airways open so that they do not collapse when your muscles relax during sleep. BIPAP is similar to CPAP except the amount of pressure is increased when you inhale. This helps you take larger breaths. Your health care provider will recommend whether CPAP or BIPAP would be more helpful for you.  WHY ARE CPAP AND BIPAP TREATMENTS USED? CPAP or BIPAP can be helpful if you have:   Sleep apnea.   Chronic obstructive pulmonary disease (COPD).   Diseases that weaken the muscles of the chest, including muscular dystrophy or neurological diseases such as amyotrophic lateral sclerosis (ALS).   Other problems that cause breathing to be weak, abnormal, or difficult.  HOW IS CPAP OR BIPAP ADMINISTERED? Both CPAP and BIPAP are provided by a small machine with a flexible plastic tube that attaches to a plastic mask. The mask fits on your face, and air is blown into your air passages through your nose or mouth. The amount of pressure that is used to blow the air into your air passages can be set on the machine. Your health care provider will determine the pressure setting that should be used based on your individual needs.  WHEN SHOULD CPAP OR BIPAP BE USED? In most cases, the mask is worn only when sleeping. Generally, you will need to wear the mask throughout the night and during the daytime if you take a nap. In a few cases involving certain medical conditions, people also need to wear the mask at other times when they are  awake. Follow your health care provider's instructions for when to use the machine.  USING THE MASK  Because the mask needs to be snug, some people feel a trapped or closed-in feeling (claustrophobic) when first using the mask. You may need to get used to the mask gradually. To do this, you can first hold the mask loosely over your nose or mouth. Gradually apply the mask more snugly. You can also gradually increase the amount of time that you use the mask.   Masks are available in various types and sizes. Some fit over your mouth and nose, and some fit over just your nose. If your mask does not fit well, talk to your health care provider about getting a different one.  If you are using a nasal mask and you tend to breathe through your mouth, a chin strap may be applied to help keep your mouth closed.   The CPAP and BIPAP machines have alarms that may sound if the mask comes off or develops a leak.   If you have trouble with the mask, it is very important that you talk to your health care provider about finding a way to make the mask easier to tolerate. Do not stop using the mask. This could have a negative impact on your health. TIPS FOR USING THE MACHINE  Place your CPAP or BIPAP machine on a secure table or stand near an electrical outlet.   Know where the on-off switch is located on the machine.     Follow your health care provider's instructions for how to set the pressure on your machine and when you should use it.   Do not eat or drink while the CPAP or BIPAP machine is on. Food or fluids could get pushed into your lungs by the pressure of the CPAP or BIPAP.  Do not smoke. Tobacco smoke residue can damage the machine.   For home use, CPAP and BIPAP machines can be rented or purchased through home health care companies. Many different brands of machines are available. Renting a machine before purchasing may help you find out which particular machine works well for you. SEEK  IMMEDIATE MEDICAL CARE IF:  You have redness or open areas around your nose or mouth where the mask fits.   You have trouble operating the CPAP or BIPAP machine.   You cannot tolerate wearing the CPAP or BIPAP mask.  Document Released: 07/06/2004 Document Revised: 06/10/2013 Document Reviewed: 05/07/2013 ExitCare Patient Information 2014 [employer]. Sleep Apnea  Sleep apnea is a sleep disorder characterized by abnormal pauses in breathing while you sleep. When your breathing pauses, the level of oxygen in your blood decreases. This causes you to move out of deep sleep and into light sleep. As a result, your quality of sleep is poor, and the system that carries your blood throughout your body (cardiovascular system) experiences stress. If sleep apnea remains untreated, the following conditions can develop:  High blood pressure (hypertension).  Coronary artery disease.  Inability to achieve or maintain an erection (impotence).  Impairment of your thought process (cognitive dysfunction). There are three types of sleep apnea: 1. Obstructive sleep apnea Pauses in breathing during sleep because of a blocked airway. 2. Central sleep apnea Pauses in breathing during sleep because the area of the brain that controls your breathing does not send the correct signals to the muscles that control breathing. 3. Mixed sleep apnea A combination of both obstructive and central sleep apnea. RISK FACTORS The following risk factors can increase your risk of developing sleep apnea:  Being overweight.  Smoking.  Having narrow passages in your nose and throat.  Being of older age.  Being female.  Alcohol use.  Sedative and tranquilizer use.  Ethnicity. Among individuals younger than 35 years, African Americans are at increased risk of sleep apnea. SYMPTOMS   Difficulty staying asleep.  Daytime sleepiness and fatigue.  Loss of energy.  Irritability.  Loud, heavy snoring.  Morning  headaches.  Trouble concentrating.  Forgetfulness.  Decreased interest in sex. DIAGNOSIS  In order to diagnose sleep apnea, your caregiver will perform a physical examination. Your caregiver may suggest that you take a home sleep test. Your caregiver may also recommend that you spend the night in a sleep lab. In the sleep lab, several monitors record information about your heart, lungs, and brain while you sleep. Your leg and arm movements and blood oxygen level are also recorded. TREATMENT The following actions may help to resolve mild sleep apnea:  Sleeping on your side.   Using a decongestant if you have nasal congestion.   Avoiding the use of depressants, including alcohol, sedatives, and narcotics.   Losing weight and modifying your diet if you are overweight. There also are devices and treatments to help open your airway:  Oral appliances. These are custom-made mouthpieces that shift your lower jaw forward and slightly open your bite. This opens your airway.  Devices that create positive airway pressure. This positive pressure "splints" your airway open to help you breathe   better during sleep. The following devices create positive airway pressure:  Continuous positive airway pressure (CPAP) device. The CPAP device creates a continuous level of air pressure with an air pump. The air is delivered to your airway through a mask while you sleep. This continuous pressure keeps your airway open.  Nasal expiratory positive airway pressure (EPAP) device. The EPAP device creates positive air pressure as you exhale. The device consists of single-use valves, which are inserted into each nostril and held in place by adhesive. The valves create very little resistance when you inhale but create much more resistance when you exhale. That increased resistance creates the positive airway pressure. This positive pressure while you exhale keeps your airway open, making it easier to breath when you  inhale again.  Bilevel positive airway pressure (BPAP) device. The BPAP device is used mainly in patients with central sleep apnea. This device is similar to the CPAP device because it also uses an air pump to deliver continuous air pressure through a mask. However, with the BPAP machine, the pressure is set at two different levels. The pressure when you exhale is lower than the pressure when you inhale.  Surgery. Typically, surgery is only done if you cannot comply with less invasive treatments or if the less invasive treatments do not improve your condition. Surgery involves removing excess tissue in your airway to create a wider passage way. Document Released: 09/28/2002 Document Revised: 02/02/2013 Document Reviewed: 02/14/2012 ExitCare Patient Information 2014 [employer].  

## 2013-09-29 MED ORDER — MOMETASONE FUROATE 50 MCG/ACT NA SUSP: NASAL | Status: DC

## 2013-09-29 NOTE — Telephone Encounter (Signed)
I spoke with the patient who would like a 90 day Rx of Nasonex sent to [facility].  She will get the Rx at local pharmacy, but wants to start using mail order.  Told her we will send the Rx today.

## 2013-10-01 ENCOUNTER — Encounter: Payer: Self-pay | Admitting: Neurology

## 2013-10-24 ENCOUNTER — Other Ambulatory Visit: Payer: Self-pay | Admitting: Internal Medicine

## 2013-10-26 NOTE — Telephone Encounter (Signed)
Furosemide, Omeprazole and Potassium Chloride refilled per protocol. OV due. JG//CMA

## 2013-11-03 HISTORY — PX: OTHER SURGICAL HISTORY: SHX169

## 2013-11-26 ENCOUNTER — Encounter: Payer: Self-pay | Admitting: Internal Medicine

## 2014-01-25 ENCOUNTER — Other Ambulatory Visit (HOSPITAL_COMMUNITY): Payer: Self-pay | Admitting: Obstetrics and Gynecology

## 2014-01-25 DIAGNOSIS — Z1231 Encounter for screening mammogram for malignant neoplasm of breast: Secondary | ICD-10-CM

## 2014-03-08 ENCOUNTER — Ambulatory Visit (HOSPITAL_COMMUNITY)
Admission: RE | Admit: 2014-03-08 | Discharge: 2014-03-08 | Disposition: A | Discharge status: Home / Self Care | Payer: BC Managed Care – PPO | Source: Ambulatory Visit | Attending: Obstetrics and Gynecology | Admitting: Obstetrics and Gynecology

## 2014-03-08 DIAGNOSIS — Z1231 Encounter for screening mammogram for malignant neoplasm of breast: Secondary | ICD-10-CM | POA: Insufficient documentation

## 2014-05-05 ENCOUNTER — Encounter: Payer: Self-pay | Admitting: Internal Medicine

## 2014-05-06 ENCOUNTER — Encounter: Payer: Self-pay | Admitting: Internal Medicine

## 2014-06-22 ENCOUNTER — Ambulatory Visit (AMBULATORY_SURGERY_CENTER): Payer: Medicare Other | Admitting: *Deleted

## 2014-06-22 VITALS — Ht 66.0 in | Wt 246.4 lb

## 2014-06-22 DIAGNOSIS — Z800 Family history of malignant neoplasm of digestive organs: Secondary | ICD-10-CM

## 2014-06-22 MED ORDER — MOVIPREP 100 G PO SOLR: 1.0000 | Freq: Once | ORAL | Status: DC

## 2014-06-22 NOTE — Progress Notes (Signed)
No problems with anesthesia in the past other than a little nausea. ewm No home 02 use. ewm Uses cpap for sleep apnea. ewm Pt declined emmi. ewm

## 2014-07-01 ENCOUNTER — Encounter: Payer: Self-pay | Admitting: Internal Medicine

## 2014-07-06 ENCOUNTER — Telehealth: Payer: Self-pay

## 2014-07-06 ENCOUNTER — Encounter: Payer: BC Managed Care – PPO | Admitting: Internal Medicine

## 2014-07-06 NOTE — Telephone Encounter (Signed)
[facility] sent [place] a fax saying Nasonex is on manufacturer backorder.  They would like to know if the Rx can be changed to Flonase (Fluticasone), as this is the only other medication covered on ins at this time.  Ref # [id number] Okay to change Rx?  Please advise.  Thank you.

## 2014-07-06 NOTE — Telephone Encounter (Signed)
OK to change  To nasocort or other OTC steroid. CD

## 2014-07-07 NOTE — Telephone Encounter (Signed)
Called patient.  Got no answer, left message.

## 2014-07-23 ENCOUNTER — Other Ambulatory Visit: Payer: Self-pay | Admitting: *Deleted

## 2014-07-23 MED ORDER — LOSARTAN POTASSIUM 100 MG PO TABS: ORAL_TABLET | ORAL | Status: DC

## 2014-08-17 ENCOUNTER — Encounter: Payer: Self-pay | Admitting: Internal Medicine

## 2014-08-17 ENCOUNTER — Ambulatory Visit (AMBULATORY_SURGERY_CENTER): Payer: Medicare Other | Admitting: Internal Medicine

## 2014-08-17 VITALS — BP 135/76 | HR 68 | Temp 96.2°F | Resp 10 | Ht 66.0 in | Wt 246.0 lb

## 2014-08-17 DIAGNOSIS — Z860100 Personal history of colon polyps, unspecified: Secondary | ICD-10-CM

## 2014-08-17 DIAGNOSIS — Z800 Family history of malignant neoplasm of digestive organs: Secondary | ICD-10-CM

## 2014-08-17 MED ORDER — SODIUM CHLORIDE 0.9 % IV SOLN: 500.0000 mL | INTRAVENOUS | Status: DC

## 2014-08-17 NOTE — Patient Instructions (Signed)
YOU HAD AN ENDOSCOPIC PROCEDURE TODAY AT THE [facility]: Refer to the procedure report that was given to you for any specific questions about what was found during the examination.  If the procedure report does not answer your questions, please call your gastroenterologist to clarify.  If you requested that your care partner not be given the details of your procedure findings, then the procedure report has been included in a sealed envelope for you to review at your convenience later.  YOU SHOULD EXPECT: Some feelings of bloating in the abdomen. Passage of more gas than usual.  Walking can help get rid of the air that was put into your GI tract during the procedure and reduce the bloating. If you had a lower endoscopy (such as a colonoscopy or flexible sigmoidoscopy) you may notice spotting of blood in your stool or on the toilet paper. If you underwent a bowel prep for your procedure, then you may not have a normal bowel movement for a few days.  DIET: Your first meal following the procedure should be a light meal and then it is ok to progress to your normal diet.  A half-sandwich or bowl of soup is an example of a good first meal.  Heavy or fried foods are harder to digest and may make you feel nauseous or bloated.  Likewise meals heavy in dairy and vegetables can cause extra gas to form and this can also increase the bloating.  Drink plenty of fluids but you should avoid alcoholic beverages for 24 hours.  ACTIVITY: Your care partner should take you home directly after the procedure.  You should plan to take it easy, moving slowly for the rest of the day.  You can resume normal activity the day after the procedure however you should NOT DRIVE or use heavy machinery for 24 hours (because of the sedation medicines used during the test).    SYMPTOMS TO REPORT IMMEDIATELY: A gastroenterologist can be reached at any hour.  During normal business hours, 8:30 AM to 5:00 PM Monday through Friday,  call [phone].  After hours and on weekends, please call the GI answering service at [phone] who will take a message and have the physician on call contact you.   Following lower endoscopy (colonoscopy or flexible sigmoidoscopy):  Excessive amounts of blood in the stool  Significant tenderness or worsening of abdominal pains  Swelling of the abdomen that is new, acute  Fever of 100F or higher  FOLLOW UP: If any biopsies were taken you will be contacted by phone or by letter within the next 1-3 weeks.  Call your gastroenterologist if you have not heard about the biopsies in 3 weeks.  Our staff will call the home number listed on your records the next business day following your procedure to check on you and address any questions or concerns that you may have at that time regarding the information given to you following your procedure. This is a courtesy call and so if there is no answer at the home number and we have not heard from you through the emergency physician on call, we will assume that you have returned to your regular daily activities without incident.  SIGNATURES/CONFIDENTIALITY: You and/or your care partner have signed paperwork which will be entered into your electronic medical record.  These signatures attest to the fact that that the information above on your After Visit Summary has been reviewed and is understood.  Full responsibility of the confidentiality of this   discharge information lies with you and/or your care-partner.  Resume medications. 

## 2014-08-17 NOTE — Op Note (Signed)
[place]  [address]. [place], [id number]   COLONOSCOPY PROCEDURE REPORT  PATIENT: Sandra Stevens, Sandra Stevens  MR#: [id number] BIRTHDATE: [birth year] , 58  yrs. old GENDER: female ENDOSCOPIST: Eustace Quail, MD REFERRED [id number] Program Recall PROCEDURE DATE:  08/17/2014 PROCEDURE:   Colonoscopy, surveillance First Screening Colonoscopy - Avg.  risk and is 50 yrs.  old or older - No.  Prior Negative Screening - Now for repeat screening. N/A  History of Adenoma - Now for follow-up colonoscopy & has been > or = to 3 yrs.  Yes hx of adenoma.  Has been 3 or more years since last colonoscopy.  Polyps Removed Today? No.  Recommend repeat exam, <10 yrs? Yes.  High risk (family or personal hx). ASA CLASS:   Class II INDICATIONS:patient's immediate family history of colon cancer (parent 29; MGP)and surveillance colonoscopy based on a history of adenomatous colonic polyp(s).  Last exalm 2010 w/ TA MEDICATIONS: Monitored anesthesia care and Propofol 260 mg IV  DESCRIPTION OF PROCEDURE:   After the risks benefits and alternatives of the procedure were thoroughly explained, informed consent was obtained.  The digital rectal exam revealed no abnormalities of the rectum.   The LB [id number]  endoscope was introduced through the anus and advanced to the cecum, which was identified by both the appendix and ileocecal valve. No adverse events experienced.   The quality of the prep was excellent, using MoviPrep  The instrument was then slowly withdrawn as the colon was fully examined.      COLON FINDINGS: A normal appearing cecum, ileocecal valve, and appendiceal orifice were identified.  The ascending, transverse, descending, sigmoid colon, and rectum appeared unremarkable. Retroflexed views revealed internal hemorrhoids. The time to cecum=3 minutes 37 seconds.  Withdrawal time=7 minutes 38 seconds. The scope was withdrawn and the procedure  completed.  COMPLICATIONS: There were no immediate complications.  ENDOSCOPIC IMPRESSION: 1.Normal colonoscopy  RECOMMENDATIONS: 1. Follow up colonoscopy in 5 years (family / personal hx)  eSigned:  Eustace Quail, MD 08/17/2014 8:41 AM   cc: Hendricks Limes, MD and The Patient

## 2014-08-17 NOTE — Progress Notes (Signed)
Report to PACU, RN, vss, BBS= Clear.  

## 2014-08-18 ENCOUNTER — Telehealth: Payer: Self-pay | Admitting: *Deleted

## 2014-08-18 NOTE — Telephone Encounter (Signed)
  Follow up Call-  Call back number 08/17/2014  Post procedure Call Back phone  # [phone]  Permission to leave phone message Yes     Patient questions:  Do you have a fever, pain , or abdominal swelling? No. Pain Score  0 *  Have you tolerated food without any problems? Yes.    Have you been able to return to your normal activities? Yes.    Do you have any questions about your discharge instructions: Diet   No. Medications  No. Follow up visit  No.  Do you have questions or concerns about your Care? No.  Actions: * If pain score is 4 or above: No action needed, pain <4.

## 2014-08-29 ENCOUNTER — Encounter: Payer: Self-pay | Admitting: Internal Medicine

## 2014-09-28 ENCOUNTER — Ambulatory Visit: Payer: BC Managed Care – PPO | Admitting: Neurology

## 2014-09-28 ENCOUNTER — Encounter: Payer: Self-pay | Admitting: Neurology

## 2014-09-28 ENCOUNTER — Ambulatory Visit (INDEPENDENT_AMBULATORY_CARE_PROVIDER_SITE_OTHER): Payer: Medicare Other | Admitting: Neurology

## 2014-09-28 DIAGNOSIS — J3089 Other allergic rhinitis: Secondary | ICD-10-CM

## 2014-09-28 DIAGNOSIS — E669 Obesity, unspecified: Secondary | ICD-10-CM

## 2014-09-28 DIAGNOSIS — G4733 Obstructive sleep apnea (adult) (pediatric): Secondary | ICD-10-CM

## 2014-09-28 DIAGNOSIS — Z9989 Dependence on other enabling machines and devices: Secondary | ICD-10-CM

## 2014-09-28 MED ORDER — MOMETASONE FUROATE 50 MCG/ACT NA SUSP: NASAL | Status: DC

## 2014-09-28 MED ORDER — ZALEPLON 10 MG PO CAPS: ORAL_CAPSULE | ORAL | Status: DC

## 2014-09-28 NOTE — Patient Instructions (Signed)
Polysomnography (Sleep Studies) Polysomnography (PSG) is a series of tests used for detecting (diagnosing) obstructive sleep apnea and other sleep disorders. The tests measure how some parts of your body are working while you are sleeping. The tests are extensive and expensive. They are done in a sleep lab or hospital, and vary from center to center. Your caregiver may perform other more simple sleep studies and questionnaires before doing more complete and involved testing. Testing may not be covered by insurance. Some of these tests are:  An EEG (Electroencephalogram). This tests your brain waves and stages of sleep.  An EOG (Electrooculogram). This measures the movements of your eyes. It detects periods of REM (rapid eye movement) sleep, which is your dream sleep.  An EKG (Electrocardiogram). This measures your heart rhythm.  EMG (Electromyography). This is a measurement of how the muscles are working in your upper airway and your legs while sleeping.  An oximetry measurement. It measures how much oxygen (air) you are getting while sleeping.  Breathing efforts may be measured. The same test can be interpreted (understood) differently by different caregivers and centers that study sleep.  Studies may be given an apnea/hypopnea index (AHI). This is a number which is found by counting the times of no breathing or under breathing during the night, and relating those numbers to the amount of time spent in bed. When the AHI is greater than 15, the patient is likely to complain of daytime sleepiness. When the AHI is greater than 30, the patient is at increased risk for heart problems and must be followed more closely. Following the AHI also allows you to know how treatment is working. Simple oximetry (tracking the amount of oxygen that is taken in) can be used for screening patients who:  Do not have symptoms (problems) of OSA.  Have a normal Epworth Sleepiness Scale Score.  Have a low pre-test  probability of having OSA.  Have none of the upper airway problems likely to cause apnea.  Oximetry is also used to determine if treatment is effective in patients who showed significant desaturations (not getting enough oxygen) on their home sleep study. One extra measure of safety is to perform additional studies for the person who only snores. This is because no one can predict with absolute certainty who will have OSA. Those who show significant desaturations (not getting enough oxygen) are recommended to have a more detailed sleep study. Document Released: 04/14/2003 Document Revised: 12/31/2011 Document Reviewed: 12/14/2013 ExitCare Patient Information 2015 [employer]. This information is not intended to replace advice given to you by your health care provider. Make sure you discuss any questions you have with your health care provider.  

## 2014-09-28 NOTE — Progress Notes (Signed)
[facility]  Provider:  Larey Seat, M D  Referring Provider: Hendricks Limes, MD Primary Care Physician:  Unice Cobble, MD  Chief Complaint  Patient presents with  . RV sleep cpap    Rm 11, alone    HPI:  Sandra Stevens is a 63 y.o. female  Is seen here as a yearly revisit for CPAP evaluation, OSA, obesity  .    Interval history 09-28-14   , the patient reports good compliance to CPAP , but her machine is not longer  Storing dad ta and has begun to switch itself off in the night. She needs urgent retitration and is now on Medicare , needing a re qualifying  Split study.  The patient has TMJ  And often related pain , is not a dental device candidate . Due to the broken machine , her adherence data are likely less stellar .    Last visits summary:  Sandra Stevens in our office over 6 years ago .  She has been followed by [name] for her primary care needs for decades . She was diagnosed with sleep apnea and a sleep study at [facility] sleep on 01-12-09 with an AHI of 14.2. The patient has been using CPAP set at 7 cm water with an EPR of 2.  For the last 4 years her average time on CPAP  therapy  was 4 hours and 79 minutes,  the residual AHI was 1.1.  She is followed by [facility] and she had followup compliance visit throughout the year 2010.  She does get her replacement parents still throughout [facility].  I was able today to obtain an in office download on 09-28-13 is the residual AHI of 1.7 and an average daytime and therapy of 7 hours 37 minutes,  there is a mild air leak and for the last 68 days 100% compliance over 4 hours or more at night.  CPAP was still set at 7 cm water with 2 cm EPI and the patient is using a nasal pillow mask. She endorsed today the Epworth sleepiness score at only 4 points which has been the same as on 2010 after CPAP was first initiated, her fatigue severity score is 25 points  (below average).    Sandra Stevens is sleep  habits are as follows. She is now retired. She will go to vaginal between midnight and 1 AM and can afford to sleep until 8 or 9 AM, one nocturia break, sleeps through, no reported snoring break through snoring or apnea, no sleep walking- talking.  Some times wakes with a dry mouth, when her nose is stuffy. She has breakfast with 2 cups of coffee, exercises ( walking for one hour), no naps in daytime. Her bedroom is cool , quiet and dark. She cannot sleep without CPAP anymore.  No sodas or tea in daytime, non smoker , non drinker.   No known family history of sleep disorders, but her mother had insomnia and snoring.  Patient was not excessively sleepy during her childhood or school year is. Her apnea came with her 50's and with weight gain.     PLAN : Her humidifier needs to be adjusted , she would like nasonex and a chin strip. [facility]>     Review of Systems: Out of a complete 14 system review, the patient complains of only the following symptoms, and all other reviewed systems are negative. epworth 4, FSS 24 . Weight gain over 4 years 30 pounds.  Lost already 20 pounds since  February 2014.   History   Social History  . Marital Status: Married    Spouse Name: N/A    Number of Children: 2  . Years of Education: N/A   Occupational History  . retired-IT    Social History Main Topics  . Smoking status: Former Smoker -- 1.00 packs/day for 10 years    Types: Cigarettes    Quit date: 10/22/1998  . Smokeless tobacco: Never Used     Comment: Quit 13 years ago as of 2012  . Alcohol Use: Yes     Comment: rarely  . Drug Use: No  . Sexual Activity: Yes    Birth Control/ Protection: None   Other Topics Concern  . Not on file   Social History Narrative    Family History  Problem Relation Age of Onset  . Hypertension    . Stroke Neg Hx   . Heart disease Neg Hx   . Diabetes Neg Hx   . Esophageal cancer Neg Hx   . Rectal cancer Neg Hx   . Stomach cancer Neg Hx   . Colon cancer  Father   . Colon cancer Maternal Grandmother   . Colon cancer Cousin     fathers side    Past Medical History  Diagnosis Date  . GERD (gastroesophageal reflux disease)   . PONV (postoperative nausea and vomiting)     just nausea  . HTN (hypertension)     no meds required per Dr Linna Darner  . SVD (spontaneous vaginal delivery)     x 1  . Coronary artery calcification   . OSA on CPAP     mild OSA, CPAP of 7 cm since 2009?   Marland Kitchen Sleep apnea     on cpap    Past Surgical History  Procedure Laterality Date  . Total knee arthroplasty  '04 / '05    x2  . Ankle surgery  Nov 2012  . Knee surgery      x7  . Wrist surgery      x2  . Tonsillectomy    . Wisdom tooth extraction    . Eye surgery      bilateral lasik eye surgery  . Dilation and curettage of uterus    . Tarsal tunnel release    . Endometrial septum  06/11/12    Dr Radene Knee  . Colonoscopy w/ polypectomy      done every 5 years due to [place] colon cancer  . Colonoscopy  2010    Dr Henrene Pastor  . Facet injections  11/03/13    L4-5,L5-S1; Dr Nelva Bush  . Polypectomy      Current Outpatient Prescriptions  Medication Sig Dispense Refill  . aspirin 81 MG EC tablet Take 1 tablet (81 mg total) by mouth daily. Swallow whole. 30 tablet 12  . Calcium Carbonate (CALCIUM 600 PO) Take 1 tablet by mouth at bedtime.     . mometasone (NASONEX) 50 MCG/ACT nasal spray 2 sprays in each nostril daily 51 g 3  . Multiple Vitamin (MULTIVITAMIN) tablet Take 1 tablet by mouth daily.      Marland Kitchen omeprazole-sodium bicarbonate (ZEGERID) 40-1100 MG per capsule TAKE 1 CAPSULE AT BEDTIME 90 capsule 0  . zaleplon (SONATA) 10 MG capsule Take 10 mg by mouth at bedtime as needed (when wakes and cannot go back to sleep).   4  . furosemide (LASIX) 40 MG tablet TAKE 1 TABLET 3 TIMES A    WEEK (Patient not  taking: Reported on 09/28/2014) 39 tablet 0  . KLOR-CON M20 20 MEQ tablet TAKE 1 TABLET 3 TIMES A    WEEK. TAKING ALONG WITH    FUROSEMIDE (Patient not taking: Reported on  09/28/2014) 45 tablet 0  . losartan (COZAAR) 100 MG tablet TAKE 1 TABLET DAILY (Patient not taking: Reported on 09/28/2014) 30 tablet 0   No current facility-administered medications for this visit.    Allergies as of 09/28/2014  . (No Known Allergies)    Vitals: BP 132/84 mmHg  Pulse 64  Resp 14  Ht 5\' 6"  (1.676 m)  Wt 245 lb 12.8 oz (111.494 kg)  BMI 39.69 kg/m2 Last Weight:  Wt Readings from Last 1 Encounters:  09/28/14 245 lb 12.8 oz (111.494 kg)   Last Height:   Ht Readings from Last 1 Encounters:  09/28/14 5\' 6"  (1.676 m)    Physical exam:  General: The patient is awake, alert and appears not in acute distress. The patient is well groomed. Head: Normocephalic, atraumatic. Neck is supple. Mallampati 3, neck circumference: 13.25 . Large chest , TMJ clicking - audible !!! Cardiovascular:  Regular rate and rhythm, without  murmurs or carotid bruit, and without distended neck veins. Respiratory: Lungs are clear to auscultation. Skin:  Without evidence of edema, or rash Trunk: BMI is elevated, but  patient  has normal posture.  Neurologic exam : The patient is awake and alert, oriented to place and time.  Memory subjective described as intact. There is a normal attention span & concentration ability. Speech is fluent without  dysarthria, dysphonia or aphasia.  Mood and affect are appropriate.  Cranial nerves: Pupils are equal and briskly reactive to light. Funduscopic exam without  evidence of pallor or edema.  Extraocular movements  in vertical and horizontal planes intact and without nystagmus. Visual fields by finger perimetry are intact. Hearing to finger rub intact.  Facial sensation intact to fine touch. Facial motor strength is symmetric and tongue and uvula move midline.  Motor exam:   Normal tone and normal muscle bulk and symmetric normal strength in all extremities.  Sensory:  Fine touch, pinprick and vibration were tested in all extremities.  Proprioception  is tested in the upper extremities only. This was  normal.  Coordination: Rapid alternating movements in the fingers/hands is tested and normal.  Finger-to-nose maneuver tested and normal without evidence of ataxia, dysmetria or tremor.  Gait and station: Patient walks without assistive device .  Strength within normal limits. Stance is stable and normal.  Romberg testing normal. Patient used a cane before her knee replacements 7 surgeries. 8 and 9 years ago.   Deep tendon reflexes: in the  upper and lower extremities are symmetric and intact. Babinski maneuver response is   downgoing.   Assessment:  After physical and neurologic examination, review of laboratory studies, imaging, neurophysiology testing and pre-existing records, assessment is  Obesity with osteoarthritis,  OSA on CPAP - now not working well.  TMJ  Plan:  Treatment plan and additional workup : Patient would like to  continue on CPAP at 7 cm water  with 2 cm EPR, she had previously an  excellent 100% compliance for the last 12 month, ,but her machine is not longer working. The memory chip is corrupted. It has quit working and is 63 years old. Will need a new machine, Nasal pillow and nasal mask  replacement. Chin strap. Nasonex.  She has just new on medicare and needs a re-qualifying sleep study.  She was  with [facility], and a dependence has just in opened in [place].  I ordered a SPLIT at AHI 15 and with 45 scoring, and with capnography.  Her PCP has announced his upcoming retirement and the patient is looking for a new PCP in [place].

## 2014-09-28 NOTE — Addendum Note (Signed)
Addended by: Larey Seat on: 09/28/2014 04:25 PM   Modules accepted: Orders

## 2014-09-30 ENCOUNTER — Encounter: Payer: Self-pay | Admitting: Neurology

## 2014-09-30 DIAGNOSIS — J3089 Other allergic rhinitis: Secondary | ICD-10-CM

## 2014-10-01 ENCOUNTER — Other Ambulatory Visit: Payer: Self-pay | Admitting: Neurology

## 2014-10-01 DIAGNOSIS — Z9989 Dependence on other enabling machines and devices: Secondary | ICD-10-CM

## 2014-10-01 DIAGNOSIS — G4733 Obstructive sleep apnea (adult) (pediatric): Secondary | ICD-10-CM

## 2014-10-01 DIAGNOSIS — J3089 Other allergic rhinitis: Secondary | ICD-10-CM

## 2014-10-01 MED ORDER — FLUTICASONE PROPIONATE 50 MCG/ACT NA SUSP: 2.0000 | Freq: Every day | NASAL | Status: DC

## 2014-10-01 NOTE — Progress Notes (Signed)
Patient emailed, requesting a switch from Nasonex to [facility] generic due to cost. Dr. Brett Fairy out of the office today.

## 2014-10-05 MED ORDER — FLUTICASONE FUROATE 27.5 MCG/SPRAY NA SUSP: 2.0000 | Freq: Every day | NASAL | Status: DC

## 2014-10-08 ENCOUNTER — Other Ambulatory Visit: Payer: Self-pay

## 2014-10-08 MED ORDER — FLUTICASONE PROPIONATE 50 MCG/ACT NA SUSP: 2.0000 | Freq: Every day | NASAL | Status: DC

## 2014-10-08 NOTE — Telephone Encounter (Signed)
Patient requests 90 day Rx  

## 2014-10-18 NOTE — Telephone Encounter (Signed)
i may just do that!!!

## 2014-11-04 ENCOUNTER — Ambulatory Visit (INDEPENDENT_AMBULATORY_CARE_PROVIDER_SITE_OTHER): Payer: Medicare Other | Admitting: Neurology

## 2014-11-04 DIAGNOSIS — G4733 Obstructive sleep apnea (adult) (pediatric): Secondary | ICD-10-CM

## 2014-11-04 DIAGNOSIS — Z9989 Dependence on other enabling machines and devices: Secondary | ICD-10-CM

## 2014-11-05 NOTE — Sleep Study (Signed)
Please see the scanned sleep study interpretation located in the Procedure tab within the Chart Review Section

## 2014-11-11 ENCOUNTER — Encounter: Payer: Self-pay | Admitting: Neurology

## 2014-11-11 ENCOUNTER — Telehealth: Payer: Self-pay | Admitting: *Deleted

## 2014-11-11 NOTE — Telephone Encounter (Signed)
Patient was contacted and provided in brief the results of her sleep study.  It was explained to patient that due to the findings a follow up appointment with Dr. Brett Fairy was needed to go over her sleep study results and discuss potential treatment options.  Patient was in agreement and a follow up appointment was scheduled for 12/10/2013 at 11:00 am.  Dr. Unice Cobble was sent a copy of the report.

## 2014-12-10 ENCOUNTER — Encounter: Payer: Self-pay | Admitting: Neurology

## 2014-12-10 ENCOUNTER — Ambulatory Visit (INDEPENDENT_AMBULATORY_CARE_PROVIDER_SITE_OTHER): Payer: Medicare Other | Admitting: Neurology

## 2014-12-10 VITALS — BP 156/91 | HR 82 | Resp 18 | Ht 66.0 in | Wt 248.0 lb

## 2014-12-10 DIAGNOSIS — G4733 Obstructive sleep apnea (adult) (pediatric): Secondary | ICD-10-CM

## 2014-12-10 DIAGNOSIS — Z9989 Dependence on other enabling machines and devices: Secondary | ICD-10-CM

## 2014-12-10 DIAGNOSIS — G478 Other sleep disorders: Secondary | ICD-10-CM

## 2014-12-10 DIAGNOSIS — R351 Nocturia: Secondary | ICD-10-CM

## 2014-12-10 DIAGNOSIS — G4701 Insomnia due to medical condition: Secondary | ICD-10-CM | POA: Insufficient documentation

## 2014-12-10 DIAGNOSIS — R0683 Snoring: Secondary | ICD-10-CM | POA: Insufficient documentation

## 2014-12-10 MED ORDER — AMITRIPTYLINE HCL 10 MG PO TABS: 10.0000 mg | ORAL_TABLET | Freq: Every day | ORAL | Status: DC

## 2014-12-10 NOTE — Progress Notes (Signed)
[facility]  Provider:  Larey Seat, M D  Referring Provider: Hendricks Limes, MD Primary Care Physician:  Unice Cobble, MD  Chief Complaint  Patient presents with  . OSA CPAP    Room 10     HPI:  Sandra Stevens is a 64 y.o. female  Is seen here as a yearly revisit for CPAP evaluation, OSA, obesity  .    Interval history 09-28-14   , the patient reports good compliance to CPAP , but her machine is not longer  Storing dad ta and has begun to switch itself off in the night. She needs urgent retitration and is now on Medicare , needing a re qualifying  Split study.  The patient has TMJ  And often related pain , is not a dental device candidate . Due to the broken machine , her adherence data are likely less stellar .  [name] was seen in our office over 6 years ago .  She has been followed by [name] for her primary care needs for decades . She was diagnosed with sleep apnea and a sleep study at [facility] sleep on 01-12-09 with an AHI of 14.2. The patient has been using CPAP set at 7 cm water with an EPR of 2.  For the last 4 years her average time on CPAP  therapy  was 4 hours and 79 minutes,  the residual AHI was 1.1 No known family history of sleep disorders, but her mother had insomnia and snoring.  Patient was not excessively sleepy during her childhood or school year is. Her apnea came with her 50's and with weight gain.  Patient would like to  continue on CPAP at 7 cm water  with 2 cm EPR, she had previously an  excellent 100% compliance for the last 12 month, ,but her machine is not longer working. The memory chip is corrupted. It has quit working and is 66 years old. Will need a new machine, Nasal pillow and nasal mask  replacement. Chin strap. Nasonex.  She has just new on medicare and needs a re-qualifying sleep study.   She was with [facility], and a dependence has just in opened in [place].  I ordered a SPLIT at AHI 15 and with 4% scoring, and  with capnography.  Her PCP has announced his upcoming retirement and the patient is looking for a new PCP in [place].   12-10-14 [name] is here for her first revisit after a recent polysomnography. Patient recording was 11-04-14. [name] has been using CPAP in the past for the treatment of OSA with excessive daytime sleepiness and snoring. I surprise her apnea index was rather low. Her AHI was 5.7 and her RDI 7.2 even in supine sleep in which she spent 78% of her sleep time the AHI was 7.0. Snoring was still noted and caused some additional arousals independent of apneas. Sleep efficiency was rather poor there were no periodic limb movements or restless legs noted. The patient did not enter REM sleep. My conclusion was that the patient has sleep interruptus snoring rather than frank obstructive sleep apnea positive airway pressure therapy can be used for this rather mild degree of apnea associated with significant oxygen drops but this was not the case here. I would think that this patient may benefit more from a dental device and aggressive weight loss therapy to reduce his snoring and thereby the already mild apnea.  Mrs. Inch liked the  [employer].  Review of Systems: Out of a complete 14 system review, the patient complains of only the following symptoms, and all other reviewed systems are negative. epworth 4, FSS 24 . Weight gain over 4 years 30 pounds. Lost already 20 pounds since  February 2014.   History   Social History  . Marital Status: Married    Spouse Name: N/A  . Number of Children: 2  . Years of Education: N/A   Occupational History  . retired-IT    Social History Main Topics  . Smoking status: Former Smoker -- 1.00 packs/day for 10 years    Types: Cigarettes    Quit date: 10/22/1998  . Smokeless tobacco: Never Used     Comment: Quit 13 years ago as of 2012  . Alcohol Use: Yes     Comment: rarely  . Drug Use: No  . Sexual Activity: Yes     Birth Control/ Protection: None   Other Topics Concern  . Not on file   Social History Narrative    Family History  Problem Relation Age of Onset  . Hypertension    . Stroke Neg Hx   . Heart disease Neg Hx   . Diabetes Neg Hx   . Esophageal cancer Neg Hx   . Rectal cancer Neg Hx   . Stomach cancer Neg Hx   . Colon cancer Father   . Colon cancer Maternal Grandmother   . Colon cancer Cousin     fathers side    Past Medical History  Diagnosis Date  . GERD (gastroesophageal reflux disease)   . PONV (postoperative nausea and vomiting)     just nausea  . HTN (hypertension)     no meds required per Dr Linna Darner  . SVD (spontaneous vaginal delivery)     x 1  . Coronary artery calcification   . OSA on CPAP     mild OSA, CPAP of 7 cm since 2009?   Marland Kitchen Sleep apnea     on cpap    Past Surgical History  Procedure Laterality Date  . Total knee arthroplasty  '04 / '05    x2  . Ankle surgery  Nov 2012  . Knee surgery      x7  . Wrist surgery      x2  . Tonsillectomy    . Wisdom tooth extraction    . Eye surgery      bilateral lasik eye surgery  . Dilation and curettage of uterus    . Tarsal tunnel release    . Endometrial septum  06/11/12    Dr Radene Knee  . Colonoscopy w/ polypectomy      done every 5 years due to [place] colon cancer  . Colonoscopy  2010    Dr Henrene Pastor  . Facet injections  11/03/13    L4-5,L5-S1; Dr Nelva Bush  . Polypectomy      Current Outpatient Prescriptions  Medication Sig Dispense Refill  . aspirin 81 MG EC tablet Take 1 tablet (81 mg total) by mouth daily. Swallow whole. 30 tablet 12  . Calcium Carbonate (CALCIUM 600 PO) Take 1 tablet by mouth at bedtime.     . fluticasone (FLONASE) 50 MCG/ACT nasal spray Place 2 sprays into both nostrils daily. 48 g 3  . fluticasone (VERAMYST) 27.5 MCG/SPRAY nasal spray Place 2 sprays into the nose daily. 10 g 12  . furosemide (LASIX) 40 MG tablet TAKE 1 TABLET 3 TIMES A    WEEK 39 tablet 0  . KLOR-CON M20  20 MEQ tablet TAKE  1 TABLET 3 TIMES A    WEEK. TAKING ALONG WITH    FUROSEMIDE 45 tablet 0  . losartan (COZAAR) 100 MG tablet TAKE 1 TABLET DAILY 30 tablet 0  . Multiple Vitamin (MULTIVITAMIN) tablet Take 1 tablet by mouth daily.      Marland Kitchen omeprazole-sodium bicarbonate (ZEGERID) 40-1100 MG per capsule TAKE 1 CAPSULE AT BEDTIME 90 capsule 0  . zaleplon (SONATA) 10 MG capsule Use at night, do not exceeed one tab , do not drive for 8 hours after taking it. 30 capsule 4   No current facility-administered medications for this visit.    Allergies as of 12/10/2014  . (No Known Allergies)    Vitals: BP 156/91 mmHg  Pulse 82  Resp 18  Ht 5\' 6"  (1.676 m)  Wt 248 lb (112.492 kg)  BMI 40.05 kg/m2 Last Weight:  Wt Readings from Last 1 Encounters:  12/10/14 248 lb (112.492 kg)   Last Height:   Ht Readings from Last 1 Encounters:  12/10/14 5\' 6"  (1.676 m)    Physical exam:  General: The patient is awake, alert and appears not in acute distress. The patient is well groomed. Head: Normocephalic, atraumatic. Neck is supple. Mallampati 3, neck circumference: 13.25 . Large chest , TMJ clicking - audible !!! Cardiovascular:  Regular rate and rhythm, without  murmurs or carotid bruit, and without distended neck veins. Respiratory: Lungs are clear to auscultation. Skin:  Without evidence of edema, or rash Trunk: BMI is elevated, but  patient  has normal posture.  Neurologic exam : The patient is awake and alert, oriented to place and time.   Memory subjective described as intact. There is a normal attention span & concentration ability.  Speech is fluent without  dysarthria, dysphonia or aphasia.  Mood and affect are appropriate.  Cranial nerves: Pupils are equal and briskly reactive to light. Funduscopic exam without  evidence of pallor or edema.  Extraocular movements  in vertical and horizontal planes intact and without nystagmus. Visual fields by finger perimetry are intact. Hearing to finger rub intact.   Facial sensation intact to fine touch. Facial motor strength is symmetric and tongue and uvula move midline.  Assessment:  After physical and neurologic examination, review of laboratory studies, imaging, neurophysiology testing and pre-existing records, assessment is  Obesity with osteoarthritis,  OSA on CPAP - now not working well. She will not need CPAP necessary, based on the new PSG results. I would like to refer her to a dentist with sleep experience. TMJ

## 2014-12-10 NOTE — Addendum Note (Signed)
Addended by: Larey Seat on: 12/10/2014 11:34 AM   Modules accepted: Orders

## 2014-12-10 NOTE — Patient Instructions (Signed)
Mandibular advancement therapy.

## 2014-12-21 ENCOUNTER — Telehealth: Payer: Self-pay | Admitting: Neurology

## 2014-12-21 NOTE — Telephone Encounter (Signed)
Patient stated prior authorization is needed for Rx amitriptyline (ELAVIL) 10 MG tablet.  Please call and advise.

## 2014-12-21 NOTE — Telephone Encounter (Signed)
I have contacted ins and provided all clinical info.  Request is currently under review Ref Key: [id number]. I called the patient back, got no answer.  Left message.

## 2014-12-23 ENCOUNTER — Telehealth: Payer: Self-pay | Admitting: Neurology

## 2014-12-23 NOTE — Telephone Encounter (Signed)
Nakia with [facility] [employer] is calling to advise that medication amitriptyline (ELAVIL) 10 MG tablet was denied due to the diagnosis. She states a letter will be sent as well.

## 2014-12-27 NOTE — Telephone Encounter (Signed)
Patient is able to get this med for $3.99 per fill at pharmacy without ins.  ID: [id number], Group: [id number] BIN: [id number] OMA:[id number].  They will process order and contact patient when Rx is ready for pick up.

## 2015-01-10 ENCOUNTER — Encounter: Payer: Self-pay | Admitting: Neurology

## 2015-01-12 MED ORDER — AMPHETAMINE-DEXTROAMPHETAMINE 10 MG PO TABS: 10.0000 mg | ORAL_TABLET | Freq: Two times a day (BID) | ORAL | Status: DC

## 2015-01-12 NOTE — Telephone Encounter (Signed)
But definitely possible,  15 mg capsules  or 2 times  10 mg tablets?

## 2015-01-13 ENCOUNTER — Telehealth: Payer: Self-pay | Admitting: *Deleted

## 2015-01-13 NOTE — Telephone Encounter (Signed)
Called and spoke to pt for pick up of Adderall prescription.  Aware of photo ID and daytime hours.

## 2015-01-25 ENCOUNTER — Telehealth: Payer: Self-pay | Admitting: Neurology

## 2015-01-25 NOTE — Telephone Encounter (Signed)
Pearla Dubonnet with [facility] @ [phone], stated non formulary Rx amphetamine-dextroamphetamine (ADDERALL) 10 MG tablet has been denied.  Patient will receive denial letter.

## 2015-02-11 ENCOUNTER — Other Ambulatory Visit (HOSPITAL_COMMUNITY): Payer: Self-pay | Admitting: Obstetrics and Gynecology

## 2015-02-11 DIAGNOSIS — Z1231 Encounter for screening mammogram for malignant neoplasm of breast: Secondary | ICD-10-CM

## 2015-03-16 ENCOUNTER — Ambulatory Visit (HOSPITAL_COMMUNITY)
Admission: RE | Admit: 2015-03-16 | Discharge: 2015-03-16 | Disposition: A | Discharge status: Home / Self Care | Payer: Medicare Other | Source: Ambulatory Visit | Attending: Obstetrics and Gynecology | Admitting: Obstetrics and Gynecology

## 2015-03-16 DIAGNOSIS — Z1231 Encounter for screening mammogram for malignant neoplasm of breast: Secondary | ICD-10-CM | POA: Diagnosis not present

## 2015-04-12 ENCOUNTER — Encounter: Payer: Self-pay | Admitting: Internal Medicine

## 2015-04-18 ENCOUNTER — Other Ambulatory Visit: Payer: Self-pay

## 2015-04-28 ENCOUNTER — Telehealth: Payer: Self-pay | Admitting: Neurology

## 2015-04-28 NOTE — Telephone Encounter (Signed)
Lea with [facility] [employer] is calling regarding Rx amitriptyline (ELAVIL) 10 MG tablet for the patient. The pharmacy states has faxed a request several times. Thank you.

## 2015-04-29 NOTE — Telephone Encounter (Signed)
The patient called to advise that she wanted the dosage for amitriptyline (ELAVIL) 10 MG tablet changed to taking 2 at bedtime instead of 1. Please call.

## 2015-04-29 NOTE — Telephone Encounter (Signed)
Pt called back and wants to increase her dose of elavil to 20mg  po qhs.

## 2015-04-29 NOTE — Telephone Encounter (Signed)
I called and spoke to pharmacy ( I asked about the next mg strength available for this drug and it is 25mg  tabs).  I relayed that I would call pt and what she is asking for (since elavil does not come in 20mg  tabs).  I LMVM for pt on her phone to call us back.  Relayed we close at 1200 today.

## 2015-04-29 NOTE — Telephone Encounter (Signed)
Patient called regarding the previously stated medication and requested to have the medication changed to 20mg . Please call and advise.

## 2015-05-02 MED ORDER — AMITRIPTYLINE HCL 10 MG PO TABS: 20.0000 mg | ORAL_TABLET | Freq: Every day | ORAL | Status: DC

## 2015-05-03 MED ORDER — AMITRIPTYLINE HCL 10 MG PO TABS: 20.0000 mg | ORAL_TABLET | Freq: Every day | ORAL | Status: DC

## 2015-05-03 NOTE — Telephone Encounter (Signed)
Called pt to inform her that Dr. Brett Fairy increased her elavil to 20 mg at bedtime per pt request and it was sent to her pharmacy.  No answer, left a message asking her to call me back.

## 2015-05-03 NOTE — Telephone Encounter (Signed)
Patient returned call, please call and advise.

## 2015-05-03 NOTE — Telephone Encounter (Signed)
Spoke to pt and informed her that Dr. Brett Fairy increased her elavil to 20 mg at bedtime for insomnia. She is concerned that insurance won't cover it and wants [place] to check it. I said that I would ask our pharmacy tech and we would get back to her.

## 2015-05-03 NOTE — Telephone Encounter (Signed)
Unfortunately, unless the policy has changed, the patient's ins will not cover this medication.  Cash price, without any ins, for #60 tabs is approx $15.  I called the patient back.  Said she would just pay for meds and not use ins.  Rx has been resent for #60 (30 day supply) instead of #30.  I called the pharmacy and spoke with Vivien Rota.  She verified they have Rx and it will be under $15 without ins.

## 2015-06-21 ENCOUNTER — Other Ambulatory Visit: Payer: Self-pay | Admitting: Obstetrics and Gynecology

## 2015-06-22 LAB — CYTOLOGY - PAP

## 2015-07-20 ENCOUNTER — Ambulatory Visit: Payer: Medicare Other | Admitting: Podiatry

## 2015-07-27 ENCOUNTER — Ambulatory Visit (INDEPENDENT_AMBULATORY_CARE_PROVIDER_SITE_OTHER): Payer: Medicare Other | Admitting: Podiatry

## 2015-07-27 ENCOUNTER — Ambulatory Visit (INDEPENDENT_AMBULATORY_CARE_PROVIDER_SITE_OTHER): Payer: Medicare Other

## 2015-07-27 DIAGNOSIS — M79672 Pain in left foot: Secondary | ICD-10-CM

## 2015-07-27 DIAGNOSIS — M2042 Other hammer toe(s) (acquired), left foot: Secondary | ICD-10-CM | POA: Diagnosis not present

## 2015-07-27 DIAGNOSIS — M779 Enthesopathy, unspecified: Secondary | ICD-10-CM

## 2015-07-27 MED ORDER — TRIAMCINOLONE ACETONIDE 10 MG/ML IJ SUSP
10.0000 mg | Freq: Once | INTRAMUSCULAR | Status: AC
  Administered 2015-07-27: 10 mg

## 2015-07-27 NOTE — Progress Notes (Signed)
Subjective:     Patient ID: Sandra Stevens, female   DOB: [birth year], 64 y.o.   MRN: [id number]  HPI patient presents stating and I had a tarsal tunnel release left and left with a bad hammertoe and pain in my foot and now I'm getting pain on top of my foot which is gotten worse over the last few months. Do not remember specific injury   Review of Systems  All other systems reviewed and are negative.      Objective:   Physical Exam  Constitutional: She is oriented to person, place, and time.  Cardiovascular: Intact distal pulses.   Musculoskeletal: Normal range of motion.  Neurological: She is oriented to person, place, and time.  Skin: Skin is warm.  Nursing note and vitals reviewed.  neurovascular status intact muscle strength adequate range of motion within normal limits with patient noted to have inflammation and pain other dorsal left foot midtarsal joint and a severe rigid hammertoe deformity second digit and moderate on the third digit left with minimal to moderate discomfort on the second and third metatarsophalangeal joint. Patient does have scars on the medial side of the left Achilles and the tarsal canal were previous surgery was done and has good digital perfusion and is well oriented 3     Assessment:     Probable tendon damage secondary to previous surgery with rigid contracture digits and midtarsal joint inflammation tendinitis arthritis left    Plan:     H&P conditions reviewed and x-rays reviewed with patient. Injected the dorsal tissue 3 mg Kenalog 5 mg Xylocaine advised on open toed shoes for a little while and consideration of digital fusion if symptoms were to worsen

## 2015-07-27 NOTE — Progress Notes (Signed)
   Subjective:    Patient ID: Sandra Stevens, female    DOB: Feb 26, 1951, 64 y.o.   MRN: [id number]  HPI Pt presents with pain in her left foot intermittent in occurrence over several years. Pain runs along the medial side and across dorsal forefoot with swelling. She has a h/o of injury to foot/ankle   Review of Systems  All other systems reviewed and are negative.      Objective:   Physical Exam        Assessment & Plan:

## 2015-08-15 ENCOUNTER — Encounter: Payer: Self-pay | Admitting: Podiatry

## 2015-08-15 ENCOUNTER — Ambulatory Visit: Payer: Medicare Other | Admitting: Podiatry

## 2015-08-15 ENCOUNTER — Ambulatory Visit (INDEPENDENT_AMBULATORY_CARE_PROVIDER_SITE_OTHER): Payer: Medicare Other | Admitting: Podiatry

## 2015-08-15 VITALS — BP 109/68 | HR 72 | Resp 16

## 2015-08-15 DIAGNOSIS — M2042 Other hammer toe(s) (acquired), left foot: Secondary | ICD-10-CM | POA: Diagnosis not present

## 2015-08-15 DIAGNOSIS — G629 Polyneuropathy, unspecified: Secondary | ICD-10-CM | POA: Diagnosis not present

## 2015-08-15 DIAGNOSIS — M779 Enthesopathy, unspecified: Secondary | ICD-10-CM

## 2015-08-15 MED ORDER — GABAPENTIN 400 MG PO CAPS: 400.0000 mg | ORAL_CAPSULE | Freq: Three times a day (TID) | ORAL | Status: DC

## 2015-08-16 NOTE — Progress Notes (Signed)
Subjective:     Patient ID: Sandra Stevens, female   DOB: 21-Mar-1951, 64 y.o.   MRN: [id number]  HPI patient presents with continued pain in the lateral side of the foot left over right with inflammation and tendon irritation noted. It is relatively diffuse in its nature and also there is moderate discomfort in the metatarsophalangeal joints   Review of Systems     Objective:   Physical Exam Inflammatory tendinitis secondary to foot structure with capsulitis of the lesser MPJs    Assessment:     Reviewed condition and at this time reviewed continued tendinitis capsulitis    Plan:     Discussed treatment options and scanned for custom orthotics to reduce plantar pressure and did put 2 valgus wedge is on the left forefoot and rear foot

## 2015-08-18 NOTE — Telephone Encounter (Signed)
Error

## 2015-08-23 ENCOUNTER — Ambulatory Visit: Payer: Medicare Other | Admitting: Podiatry

## 2015-08-30 ENCOUNTER — Encounter: Payer: Self-pay | Admitting: Pulmonary Disease

## 2015-08-30 ENCOUNTER — Other Ambulatory Visit: Payer: Self-pay

## 2015-08-30 MED ORDER — FLUTICASONE PROPIONATE 50 MCG/ACT NA SUSP: 2.0000 | Freq: Every day | NASAL | Status: DC

## 2015-09-30 ENCOUNTER — Ambulatory Visit: Payer: Medicare Other | Admitting: *Deleted

## 2015-09-30 DIAGNOSIS — M779 Enthesopathy, unspecified: Secondary | ICD-10-CM

## 2015-09-30 NOTE — Progress Notes (Signed)
Patient ID: Sandra Stevens, female   DOB: [birth year], 64 y.o.   MRN: [id number] Patient presents for orthotic pick up.  Verbal and written break in and wear instructions given.  Patient will follow up in 4 weeks if symptoms worsen or fail to improve.

## 2015-09-30 NOTE — Patient Instructions (Signed)

## 2015-10-03 ENCOUNTER — Telehealth: Payer: Self-pay | Admitting: *Deleted

## 2015-10-03 MED ORDER — GABAPENTIN 400 MG PO CAPS: 400.0000 mg | ORAL_CAPSULE | Freq: Three times a day (TID) | ORAL | Status: AC

## 2015-10-03 NOTE — Telephone Encounter (Signed)
Faxed request for Gabapentin 400mg  #[phone].  Dr. Paulla Dolly ordered refill +2, pt will need an appt prior to future refills.

## 2015-10-07 ENCOUNTER — Ambulatory Visit (INDEPENDENT_AMBULATORY_CARE_PROVIDER_SITE_OTHER): Payer: Medicare Other | Admitting: Podiatry

## 2015-10-07 ENCOUNTER — Encounter: Payer: Self-pay | Admitting: Podiatry

## 2015-10-07 VITALS — BP 132/76 | HR 68 | Resp 12

## 2015-10-07 DIAGNOSIS — M779 Enthesopathy, unspecified: Secondary | ICD-10-CM

## 2015-10-07 MED ORDER — GABAPENTIN 400 MG PO CAPS: 400.0000 mg | ORAL_CAPSULE | Freq: Three times a day (TID) | ORAL | Status: DC

## 2015-10-07 MED ORDER — TRIAMCINOLONE ACETONIDE 10 MG/ML IJ SUSP
10.0000 mg | Freq: Once | INTRAMUSCULAR | Status: AC
  Administered 2015-10-07: 10 mg

## 2015-10-16 NOTE — Progress Notes (Signed)
Subjective:     Patient ID: Sandra Stevens, female   DOB: 1951-03-04, 64 y.o.   MRN: [id number]  HPI patient states that I still gets some discomfort if I been on my foot a lot and I have to wear wider shoes. Orthotics have been somewhat helpful   Review of Systems     Objective:   Physical Exam  neurovascular status intact with discomfort in the dorsum of the left foot around the midtarsal joint with fluid buildup    Assessment:      tendinitis left over right foot    Plan:      injected the tendon complex 3 mg Kenalog 5 mg Xylocaine advised on physical therapy continued orthotic usage and reappoint to recheck

## 2015-11-04 ENCOUNTER — Ambulatory Visit (INDEPENDENT_AMBULATORY_CARE_PROVIDER_SITE_OTHER): Payer: Medicare Other | Admitting: Podiatry

## 2015-11-04 ENCOUNTER — Telehealth: Payer: Self-pay | Admitting: *Deleted

## 2015-11-04 ENCOUNTER — Encounter: Payer: Self-pay | Admitting: Podiatry

## 2015-11-04 VITALS — BP 112/70 | HR 66 | Resp 12

## 2015-11-04 DIAGNOSIS — T148XXA Other injury of unspecified body region, initial encounter: Secondary | ICD-10-CM

## 2015-11-04 DIAGNOSIS — T148 Other injury of unspecified body region: Secondary | ICD-10-CM | POA: Diagnosis not present

## 2015-11-04 NOTE — Telephone Encounter (Signed)
PRIOR AUTHORIZATION [employer] [phone] STARTED FOR MRI LEFT FOOT 73718, DX TORN TENDON T14.8, PRIOR AUTHORIZATION RECEIVED - [id number], VALID 11/04/2015 TO 12/03/2015.  Faxed to [facility].

## 2015-11-06 NOTE — Progress Notes (Signed)
Subjective:     Patient ID: Sandra Stevens, female   DOB: 10-Aug-1951, 64 y.o.   MRN: [id number]  HPI patient states I'm still having a lot of pain in the outside of my left ankle that's not resolved with immobilization injection treatment and also moderate discomfort in the top of my left foot   Review of Systems     Objective:   Physical Exam  Continued edema in the lateral left ankle and the peroneal muscle group with pain as he gets close to the insertion base of fifth metatarsal. Moderate midfoot dorsal pain noted but nowhere near as bad as lateral pain and patient does have her boot and has been wearing it as indicated    Assessment:     Possible tear of the peroneal tendon left due to persistent pain and failure to respond to numerous conservative treatments    Plan:     H&P condition reviewed and at this time I have recommended MRI to try to understand pathology and decide what else may be appropriate to try to help her

## 2015-11-12 ENCOUNTER — Ambulatory Visit
Admission: RE | Admit: 2015-11-12 | Discharge: 2015-11-12 | Disposition: A | Discharge status: Home / Self Care | Payer: Medicare Other | Source: Ambulatory Visit | Attending: Podiatry | Admitting: Podiatry

## 2015-11-12 DIAGNOSIS — T148XXA Other injury of unspecified body region, initial encounter: Secondary | ICD-10-CM

## 2015-11-15 ENCOUNTER — Other Ambulatory Visit: Payer: Self-pay

## 2015-11-15 MED ORDER — AMITRIPTYLINE HCL 10 MG PO TABS: 20.0000 mg | ORAL_TABLET | Freq: Every day | ORAL | Status: DC

## 2015-12-12 ENCOUNTER — Telehealth: Payer: Self-pay

## 2015-12-12 NOTE — Telephone Encounter (Signed)
Started pa for amitriptyline via covermymeds.

## 2015-12-12 NOTE — Telephone Encounter (Signed)
It has been noted in pt's chart that pt is paying out of pocket for her amitriptyline because insurance will not cover it. I called pt to advise her that a pa request was received by our office, but if pt is continuing to pay out of pocket for this, then the pa is not needed.  Left a message asking her to call us back.

## 2015-12-12 NOTE — Telephone Encounter (Signed)
Pt is not worried about the PA. She just needs a refill amitriptyline (ELAVIL) 10 MG tablet.

## 2015-12-12 NOTE — Telephone Encounter (Signed)
[place] [phone] called to find out if insomnia is caused by Fibromyalgia? If no, what medical condition is causing insomnia? Please call to advise. Member# [id number].

## 2015-12-12 NOTE — Telephone Encounter (Signed)
Spoke to pt's [facility] [employer] at [place] and advised them that the pt will pay out of pocket for the amitriptyline and that the pa will not be completed, and that a refill is needed. They verbalized understanding.  I spoke to pt and advised her of the same. Pt verbalized understanding.

## 2015-12-14 ENCOUNTER — Telehealth: Payer: Self-pay | Admitting: Neurology

## 2015-12-14 NOTE — Telephone Encounter (Signed)
Pt knows the PA was denied and will continue paying out of pocket for amitriptyline.

## 2015-12-14 NOTE — Telephone Encounter (Signed)
[employer] called and says that pts amitriptyline (ELAVIL) 10 MG tablet has been denied. May call [phone]

## 2015-12-14 NOTE — Telephone Encounter (Signed)
[place]isa/[employer] [phone] option 5 called back again (states they have gotten no response) requesting answer to question, is insomnia caused by fibromyalgia and if not, what medical condition is causing insomnia.

## 2015-12-14 NOTE — Telephone Encounter (Signed)
Spoke to [place] at [facility], the pa was started before I spoke to the pt and she advised me that she will pay out of pocket for the medication. I advised Lattie Haw of the ICD code for osa on cpap as causing pt's insomnia. We will receive a pa determination in a few days. Maybe the pa will be approved and the pt will not have to pay out of pocket. If not, the pt is already prepared to pay out of pocket.

## 2016-01-10 DIAGNOSIS — D225 Melanocytic nevi of trunk: Secondary | ICD-10-CM | POA: Diagnosis not present

## 2016-01-10 DIAGNOSIS — L821 Other seborrheic keratosis: Secondary | ICD-10-CM | POA: Diagnosis not present

## 2016-01-10 DIAGNOSIS — I781 Nevus, non-neoplastic: Secondary | ICD-10-CM | POA: Diagnosis not present

## 2016-01-10 DIAGNOSIS — Z23 Encounter for immunization: Secondary | ICD-10-CM | POA: Diagnosis not present

## 2016-01-10 DIAGNOSIS — Z808 Family history of malignant neoplasm of other organs or systems: Secondary | ICD-10-CM | POA: Diagnosis not present

## 2016-01-10 DIAGNOSIS — D1800 Hemangioma unspecified site: Secondary | ICD-10-CM | POA: Diagnosis not present

## 2016-01-10 DIAGNOSIS — D2271 Melanocytic nevi of right lower limb, including hip: Secondary | ICD-10-CM | POA: Diagnosis not present

## 2016-01-10 DIAGNOSIS — Z86018 Personal history of other benign neoplasm: Secondary | ICD-10-CM | POA: Diagnosis not present

## 2016-01-10 DIAGNOSIS — L814 Other melanin hyperpigmentation: Secondary | ICD-10-CM | POA: Diagnosis not present

## 2016-01-11 DIAGNOSIS — H5213 Myopia, bilateral: Secondary | ICD-10-CM | POA: Diagnosis not present

## 2016-02-09 DIAGNOSIS — M1611 Unilateral primary osteoarthritis, right hip: Secondary | ICD-10-CM | POA: Diagnosis not present

## 2016-02-10 ENCOUNTER — Other Ambulatory Visit: Payer: Self-pay

## 2016-02-10 DIAGNOSIS — Z1231 Encounter for screening mammogram for malignant neoplasm of breast: Secondary | ICD-10-CM

## 2016-03-07 ENCOUNTER — Other Ambulatory Visit: Payer: Self-pay | Admitting: Neurology

## 2016-03-20 ENCOUNTER — Ambulatory Visit
Admission: RE | Admit: 2016-03-20 | Discharge: 2016-03-20 | Disposition: A | Discharge status: Home / Self Care | Payer: Medicare Other | Source: Ambulatory Visit

## 2016-03-20 DIAGNOSIS — Z1231 Encounter for screening mammogram for malignant neoplasm of breast: Secondary | ICD-10-CM

## 2016-03-23 DIAGNOSIS — M1611 Unilateral primary osteoarthritis, right hip: Secondary | ICD-10-CM | POA: Diagnosis not present

## 2016-04-04 ENCOUNTER — Other Ambulatory Visit: Payer: Self-pay | Admitting: Neurology

## 2016-04-04 NOTE — Telephone Encounter (Signed)
Needs an appt for future refills.

## 2016-05-02 ENCOUNTER — Other Ambulatory Visit: Payer: Self-pay | Admitting: Neurology

## 2016-05-02 ENCOUNTER — Telehealth: Payer: Self-pay

## 2016-05-02 MED ORDER — AMITRIPTYLINE HCL 10 MG PO TABS: ORAL_TABLET | ORAL | Status: DC

## 2016-05-02 NOTE — Telephone Encounter (Signed)
I called pt because I received a refill request for her elavil and she has not been seen in our office since 11/2014. Pt is agreeable to coming in for an appt on 8/30 at 1:30. Will refill medication until this appt.

## 2016-05-30 ENCOUNTER — Other Ambulatory Visit: Payer: Self-pay | Admitting: Neurology

## 2016-06-12 ENCOUNTER — Telehealth: Payer: Self-pay

## 2016-06-12 NOTE — Telephone Encounter (Signed)
Completed pa for amitriptyline, sent to [facility]. Should have a determination in 3-5 business days.

## 2016-06-14 ENCOUNTER — Other Ambulatory Visit: Payer: Self-pay | Admitting: Podiatry

## 2016-06-20 ENCOUNTER — Ambulatory Visit: Payer: Self-pay | Admitting: Neurology

## 2016-06-20 NOTE — Telephone Encounter (Signed)
PA for amitriptyline denied by [facility].

## 2016-06-26 DIAGNOSIS — Z6841 Body Mass Index (BMI) 40.0 and over, adult: Secondary | ICD-10-CM | POA: Diagnosis not present

## 2016-06-26 DIAGNOSIS — N958 Other specified menopausal and perimenopausal disorders: Secondary | ICD-10-CM | POA: Diagnosis not present

## 2016-06-26 DIAGNOSIS — Z13820 Encounter for screening for osteoporosis: Secondary | ICD-10-CM | POA: Diagnosis not present

## 2016-06-26 DIAGNOSIS — Z01419 Encounter for gynecological examination (general) (routine) without abnormal findings: Secondary | ICD-10-CM | POA: Diagnosis not present

## 2016-07-03 ENCOUNTER — Ambulatory Visit: Payer: Self-pay | Admitting: Orthopedic Surgery

## 2016-07-12 DIAGNOSIS — H40013 Open angle with borderline findings, low risk, bilateral: Secondary | ICD-10-CM | POA: Diagnosis not present

## 2016-07-20 ENCOUNTER — Encounter (HOSPITAL_COMMUNITY): Payer: Self-pay

## 2016-07-20 DIAGNOSIS — I10 Essential (primary) hypertension: Secondary | ICD-10-CM | POA: Diagnosis not present

## 2016-07-20 DIAGNOSIS — Z791 Long term (current) use of non-steroidal anti-inflammatories (NSAID): Secondary | ICD-10-CM | POA: Diagnosis not present

## 2016-07-20 DIAGNOSIS — Z23 Encounter for immunization: Secondary | ICD-10-CM | POA: Diagnosis not present

## 2016-07-20 DIAGNOSIS — Z0000 Encounter for general adult medical examination without abnormal findings: Secondary | ICD-10-CM | POA: Diagnosis not present

## 2016-07-20 NOTE — Patient Instructions (Addendum)
Sandra Stevens  07/20/2016   Your procedure is scheduled on: 08-01-16  Report to [facility]  Entrance take [facility]  elevators to 3rd floor to  [place] at  [place] AM.  Call this number if you have problems the morning of surgery [phone]   Remember: ONLY 1 PERSON MAY GO WITH YOU TO SHORT STAY TO GET  READY MORNING OF [place].  Do not eat food or drink liquids :After Midnight.     Take these medicines the morning of surgery with A SIP OF WATER: None. DO NOT TAKE ANY DIABETIC MEDICATIONS DAY OF YOUR SURGERY                               You may not have any metal on your body including hair pins and              piercings  Do not wear jewelry, make-up, lotions, powders or perfumes, deodorant             Do not wear nail polish.  Do not shave  48 hours prior to surgery.              Men may shave face and neck.   Do not bring valuables to the hospital. [place].  Contacts, dentures or bridgework may not be worn into surgery.  Leave suitcase in the car. After surgery it may be brought to your room.     Patients discharged the day of surgery will not be allowed to drive home.  Name and phone number of your driver: Robert-spouse [phone] cell  Special Instructions: N/A              Please read over the following fact sheets you were given: _____________________________________________________________________             [facility] - Preparing for Surgery Before surgery, you can play an important role.  Because skin is not sterile, your skin needs to be as free of germs as possible.  You can reduce the number of germs on your skin by washing with CHG (chlorahexidine gluconate) soap before surgery.  CHG is an antiseptic cleaner which kills germs and bonds with the skin to continue killing germs even after washing. Please DO NOT use if you have an allergy to CHG or antibacterial soaps.  If  your skin becomes reddened/irritated stop using the CHG and inform your nurse when you arrive at Short Stay. Do not shave (including legs and underarms) for at least 48 hours prior to the first CHG shower.  You may shave your face/neck. Please follow these instructions carefully:  1.  Shower with CHG Soap the night before surgery and the  morning of Surgery.  2.  If you choose to wash your hair, wash your hair first as usual with your  normal  shampoo.  3.  After you shampoo, rinse your hair and body thoroughly to remove the  shampoo.                           4.  Use CHG as you would any other liquid soap.  You can apply chg directly  to the skin  and wash                       Gently with a scrungie or clean washcloth.  5.  Apply the CHG Soap to your body ONLY FROM THE NECK DOWN.   Do not use on face/ open                           Wound or open sores. Avoid contact with eyes, ears mouth and genitals (private parts).                       Wash face,  Genitals (private parts) with your normal soap.             6.  Wash thoroughly, paying special attention to the area where your surgery  will be performed.  7.  Thoroughly rinse your body with warm water from the neck down.  8.  DO NOT shower/wash with your normal soap after using and rinsing off  the CHG Soap.                9.  Pat yourself dry with a clean towel.            10.  Wear clean pajamas.            11.  Place clean sheets on your bed the night of your first shower and do not  sleep with pets. Day of Surgery : Do not apply any lotions/deodorants the morning of surgery.  Please wear clean clothes to the hospital/surgery center.  FAILURE TO FOLLOW THESE INSTRUCTIONS MAY RESULT IN THE CANCELLATION OF YOUR SURGERY PATIENT SIGNATURE_________________________________  NURSE SIGNATURE__________________________________  ________________________________________________________________________   Adam Phenix  An incentive  spirometer is a tool that can help keep your lungs clear and active. This tool measures how well you are filling your lungs with each breath. Taking long deep breaths may help reverse or decrease the chance of developing breathing (pulmonary) problems (especially infection) following:  A long period of time when you are unable to move or be active. BEFORE THE PROCEDURE   If the spirometer includes an indicator to show your best effort, your nurse or respiratory therapist will set it to a desired goal.  If possible, sit up straight or lean slightly forward. Try not to slouch.  Hold the incentive spirometer in an upright position. INSTRUCTIONS FOR USE  1. Sit on the edge of your bed if possible, or sit up as far as you can in bed or on a chair. 2. Hold the incentive spirometer in an upright position. 3. Breathe out normally. 4. Place the mouthpiece in your mouth and seal your lips tightly around it. 5. Breathe in slowly and as deeply as possible, raising the piston or the ball toward the top of the column. 6. Hold your breath for 3-5 seconds or for as long as possible. Allow the piston or ball to fall to the bottom of the column. 7. Remove the mouthpiece from your mouth and breathe out normally. 8. Rest for a few seconds and repeat Steps 1 through 7 at least 10 times every 1-2 hours when you are awake. Take your time and take a few normal breaths between deep breaths. 9. The spirometer may include an indicator to show your best effort. Use the indicator as a goal to work toward during each repetition. 10. After each set of 10  deep breaths, practice coughing to be sure your lungs are clear. If you have an incision (the cut made at the time of surgery), support your incision when coughing by placing a pillow or rolled up towels firmly against it. Once you are able to get out of bed, walk around indoors and cough well. You may stop using the incentive spirometer when instructed by your caregiver.   RISKS AND COMPLICATIONS  Take your time so you do not get dizzy or light-headed.  If you are in pain, you may need to take or ask for pain medication before doing incentive spirometry. It is harder to take a deep breath if you are having pain. AFTER USE  Rest and breathe slowly and easily.  It can be helpful to keep track of a log of your progress. Your caregiver can provide you with a simple table to help with this. If you are using the spirometer at home, follow these instructions: [place] IF:   You are having difficultly using the spirometer.  You have trouble using the spirometer as often as instructed.  Your pain medication is not giving enough relief while using the spirometer.  You develop fever of 100.5 F (38.1 C) or higher. SEEK IMMEDIATE MEDICAL CARE IF:   You cough up bloody sputum that had not been present before.  You develop fever of 102 F (38.9 C) or greater.  You develop worsening pain at or near the incision site. MAKE SURE YOU:   Understand these instructions.  Will watch your condition.  Will get help right away if you are not doing well or get worse. Document Released: 02/18/2007 Document Revised: 12/31/2011 Document Reviewed: 04/21/2007 ExitCare Patient Information 2014 ExitCare, [place].   ________________________________________________________________________  WHAT IS A BLOOD TRANSFUSION? Blood Transfusion Information  A transfusion is the replacement of blood or some of its parts. Blood is made up of multiple cells which provide different functions.  Red blood cells carry oxygen and are used for blood loss replacement.  White blood cells fight against infection.  Platelets control bleeding.  Plasma helps clot blood.  Other blood products are available for specialized needs, such as hemophilia or other clotting disorders. BEFORE THE TRANSFUSION  Who gives blood for transfusions?   Healthy volunteers who are fully evaluated  to make sure their blood is safe. This is blood bank blood. Transfusion therapy is the safest it has ever been in the practice of medicine. Before blood is taken from a donor, a complete history is taken to make sure that person has no history of diseases nor engages in risky social behavior (examples are intravenous drug use or sexual activity with multiple partners). The donor's travel history is screened to minimize risk of transmitting infections, such as malaria. The donated blood is tested for signs of infectious diseases, such as HIV and hepatitis. The blood is then tested to be sure it is compatible with you in order to minimize the chance of a transfusion reaction. If you or a relative donates blood, this is often done in anticipation of surgery and is not appropriate for emergency situations. It takes many days to process the donated blood. RISKS AND COMPLICATIONS Although transfusion therapy is very safe and saves many lives, the main dangers of transfusion include:   Getting an infectious disease.  Developing a transfusion reaction. This is an allergic reaction to something in the blood you were given. Every precaution is taken to prevent this. The decision to have a blood transfusion  has been considered carefully by your caregiver before blood is given. Blood is not given unless the benefits outweigh the risks. AFTER THE TRANSFUSION  Right after receiving a blood transfusion, you will usually feel much better and more energetic. This is especially true if your red blood cells have gotten low (anemic). The transfusion raises the level of the red blood cells which carry oxygen, and this usually causes an energy increase.  The nurse administering the transfusion will monitor you carefully for complications. HOME CARE INSTRUCTIONS  No special instructions are needed after a transfusion. You may find your energy is better. Speak with your caregiver about any limitations on activity for  underlying diseases you may have. SEEK MEDICAL CARE IF:   Your condition is not improving after your transfusion.  You develop redness or irritation at the intravenous (IV) site. SEEK IMMEDIATE MEDICAL CARE IF:  Any of the following symptoms occur over the next 12 hours:  Shaking chills.  You have a temperature by mouth above 102 F (38.9 C), not controlled by medicine.  Chest, back, or muscle pain.  People around you feel you are not acting correctly or are confused.  Shortness of breath or difficulty breathing.  Dizziness and fainting.  You get a rash or develop hives.  You have a decrease in urine output.  Your urine turns a dark color or changes to pink, red, or brown. Any of the following symptoms occur over the next 10 days:  You have a temperature by mouth above 102 F (38.9 C), not controlled by medicine.  Shortness of breath.  Weakness after normal activity.  The white part of the eye turns yellow (jaundice).  You have a decrease in the amount of urine or are urinating less often.  Your urine turns a dark color or changes to pink, red, or brown. Document Released: 10/05/2000 Document Revised: 12/31/2011 Document Reviewed: 05/24/2008 [facility] Patient Information 2014 [place], [place].  _______________________________________________________________________

## 2016-07-23 ENCOUNTER — Encounter (HOSPITAL_COMMUNITY): Payer: Self-pay

## 2016-07-23 ENCOUNTER — Other Ambulatory Visit: Payer: Self-pay

## 2016-07-23 ENCOUNTER — Encounter (HOSPITAL_COMMUNITY)
Admission: RE | Admit: 2016-07-23 | Discharge: 2016-07-23 | Disposition: A | Discharge status: Home / Self Care | Payer: Medicare Other | Source: Ambulatory Visit | Attending: Orthopedic Surgery | Admitting: Orthopedic Surgery

## 2016-07-23 DIAGNOSIS — M1611 Unilateral primary osteoarthritis, right hip: Secondary | ICD-10-CM | POA: Diagnosis not present

## 2016-07-23 DIAGNOSIS — Z01818 Encounter for other preprocedural examination: Secondary | ICD-10-CM | POA: Diagnosis not present

## 2016-07-23 DIAGNOSIS — Z01812 Encounter for preprocedural laboratory examination: Secondary | ICD-10-CM | POA: Insufficient documentation

## 2016-07-23 HISTORY — DX: Asymptomatic varicose veins of unspecified lower extremity: I83.90

## 2016-07-23 HISTORY — DX: Unspecified osteoarthritis, unspecified site: M19.90

## 2016-07-23 HISTORY — DX: Unspecified hemorrhoids: K64.9

## 2016-07-23 HISTORY — DX: Cystitis, unspecified without hematuria: N30.90

## 2016-07-23 LAB — PROTIME-INR
INR: 0.96
Prothrombin Time: 12.8 seconds (ref 11.4–15.2)

## 2016-07-23 LAB — ABO/RH: ABO/RH(D): A NEG

## 2016-07-23 LAB — SURGICAL PCR SCREEN
MRSA, PCR: NEGATIVE
Staphylococcus aureus: POSITIVE — AB

## 2016-07-23 LAB — APTT: aPTT: 35 seconds (ref 24–36)

## 2016-07-23 NOTE — Pre-Procedure Instructions (Addendum)
07-23-16 CBC,CMP, LP, UA, Vitamin D, A1C  Results done 07-12-16 with chart. EKG done today.

## 2016-07-24 NOTE — Pre-Procedure Instructions (Signed)
07-24-16 1310 Patient made aware of Positive Staph aureus, Rx for Mupirocin called to [facility]. Pt to use as directed. Note per Epic to make Dr. Wynelle Link aware.

## 2016-07-25 DIAGNOSIS — Z0000 Encounter for general adult medical examination without abnormal findings: Secondary | ICD-10-CM | POA: Diagnosis not present

## 2016-07-25 DIAGNOSIS — M25551 Pain in right hip: Secondary | ICD-10-CM | POA: Diagnosis not present

## 2016-07-25 DIAGNOSIS — M79672 Pain in left foot: Secondary | ICD-10-CM | POA: Diagnosis not present

## 2016-07-31 ENCOUNTER — Ambulatory Visit: Payer: Self-pay | Admitting: Orthopedic Surgery

## 2016-07-31 NOTE — Anesthesia Preprocedure Evaluation (Signed)
Anesthesia Evaluation  Patient identified by MRN, date of birth, ID band Patient awake    Reviewed: Allergy & Precautions, NPO status , Patient's Chart, lab work & pertinent test results  History of Anesthesia Complications (+) PONV and history of anesthetic complications  Airway Mallampati: II  TM Distance: >3 FB Neck ROM: Full    Dental no notable dental hx. (+) Dental Advisory Given   Pulmonary sleep apnea , former smoker,    Pulmonary exam normal breath sounds clear to auscultation       Cardiovascular hypertension, Pt. on medications + CAD  Normal cardiovascular exam Rhythm:Regular Rate:Normal     Neuro/Psych negative neurological ROS  negative psych ROS   GI/Hepatic Neg liver ROS, GERD  ,  Endo/Other  obesity  Renal/GU negative Renal ROS  negative genitourinary   Musculoskeletal  (+) Arthritis ,   Abdominal   Peds negative pediatric ROS (+)  Hematology negative hematology ROS (+)   Anesthesia Other Findings   Reproductive/Obstetrics negative OB ROS                             Anesthesia Physical Anesthesia Plan  ASA: II  Anesthesia Plan: Spinal   Post-op Pain Management:    Induction:   Airway Management Planned:   Additional Equipment:   Intra-op Plan:   Post-operative Plan:   Informed Consent: I have reviewed the patients History and Physical, chart, labs and discussed the procedure including the risks, benefits and alternatives for the proposed anesthesia with the patient or authorized representative who has indicated his/her understanding and acceptance.   Dental advisory given  Plan Discussed with: CRNA  Anesthesia Plan Comments:         Anesthesia Quick Evaluation

## 2016-07-31 NOTE — H&P (Signed)
Sandra Stevens DOB: [birth year] Married / Language: English / Race: White Female Date of Admission:  08/01/2016 History of Present Illness  The patient is a 65 year old female who comes in  for a preoperative History and Physical. The patient is scheduled for a right total hip arthroplasty (anterior) to be performed by Dr. Dione Plover. Aluisio, MD at [facility] on 08/01/2016. The patient is a 65 year old female who is being followed for their right hip pain and osteoarthritis. They are months out from intra-articular injection. Symptoms reported include: pain, aching and catching. The patient feels that they are doing poorly and report their pain level to be moderate to severe. Current treatment includes: NSAIDs (Meloxicam; has helped). The following medication has been used for pain control: Tylenol (as needed at night). The patient has not gotten any relief of their symptoms with Cortisone injections (may have helped for a few days). She feels that over the past year she has had a significant increase in pain and decrease in function with regards to that right hip. She used to be an avid walker and states that she cannot get out and walk anymore because of the right hip. Subsequent to that, she has gained some weight. The hip is hurting with most activities and is now starting to hurt at night. The last injection did not provide as much benefit. She would llike to get the hip replaced at this time. They have been treated conservatively in the past for the above stated problem and despite conservative measures, they continue to have progressive pain and severe functional limitations and dysfunction. They have failed non-operative management including home exercise, medications, and injections. It is felt that they would benefit from undergoing total joint replacement. Risks and benefits of the procedure have been discussed with the patient and they elect to proceed with surgery. There are no active  contraindications to surgery such as ongoing infection or rapidly progressive neurological disease.   Problem List/Past Medical  Lumbar facet joint pain (M54.5)  Arthralgia of right hip (M25.551)  Trochanteric bursitis of right hip (M70.61)  Primary osteoarthritis of right hip (M16.11)  Primary osteoarthritis of lumbar spine (M47.816)  Asthma  Sleep Apnea  Hypertension  Varicose veins  Gastroesophageal Reflux Disease  Hemorrhoids  Cystitis  Past History Degenerative Disc Disease  Bursitis  Measles  Mumps  Rubella  Allergies Thimerosal *Antiseptics & Disinfectants**   Family History  Hip Replacements  Father.  Social History  Tobacco use  Former smoker. Alcohol use  Occasional alcohol use. one drink per week Children  One daughter [place] Living situation  Lives with spouse.  Medication History  Amitriptyline HCl (10MG  Tablet, 20 mg Oral) Active. Gabapentin (Oral) Specific strength unknown - Active. Calcium Carbonate (600MG  Tablet, Oral) Active. Pantoprazole Sodium (40MG  Tablet DR, Oral) Active. Vitamin D (1000UNIT Tablet, Oral) Active. Multivitamin Adult (Oral) Active. Aspirin (81MG  Tablet, 1 (one) Oral) Active. Meloxicam (7.5MG  Tablet, 1 (one) Oral po bid, Taken starting 05/07/2016) Active. Irbesartan-Hydrochlorothiazide (300-12.5MG  Tablet, Oral) Active. Tylenol (Oral as needed) Specific strength unknown - Active.  Past Surgical History  Total Knee Replacement - Both     Review of Systems General Not Present- Chills, Fatigue, Fever, Memory Loss, Night Sweats, Weight Gain and Weight Loss. Skin Not Present- Eczema, Hives, Itching, Lesions and Rash. HEENT Not Present- Dentures, Double Vision, Headache, Hearing Loss, Tinnitus and Visual Loss. Respiratory Not Present- Allergies, Chronic Cough, Coughing up blood,  Shortness of breath at rest and Shortness of breath  with exertion. Cardiovascular Not Present- Chest Pain, Difficulty Breathing Lying Down, Murmur, Palpitations, Racing/skipping heartbeats and Swelling. Gastrointestinal Not Present- Abdominal Pain, Bloody Stool, Constipation, Diarrhea, Difficulty Swallowing, Heartburn, Jaundice, Loss of appetitie, Nausea and Vomiting. Female Genitourinary Present- Urinary frequency and Urinating at Night. Not Present- Blood in Urine, Discharge, Flank Pain, Incontinence, Painful Urination, Urgency, Urinary Retention and Weak urinary stream. Musculoskeletal Present- Back Pain and Joint Pain. Not Present- Joint Swelling, Morning Stiffness, Muscle Pain, Muscle Weakness and Spasms. Neurological Not Present- Blackout spells, Difficulty with balance, Dizziness, Paralysis, Tremor and Weakness. Psychiatric Present- Insomnia.  Vitals Weight: 232 lb Height: 66in Weight was reported by patient. Height was reported by patient. Body Surface Area: 2.13 m Body Mass Index: 37.45 kg/m  Pulse: 64 (Regular)  BP: 112/64 (Sitting, Right Arm, Standard)  Physical Exam  General Mental Status -Alert, cooperative and good historian. General Appearance-pleasant, Not in acute distress. Orientation-Oriented X3. Build & Nutrition-Well nourished and Well developed.  Head and Neck Head-normocephalic, atraumatic . Neck Global Assessment - supple, no bruit auscultated on the right, no bruit auscultated on the left.  Eye Pupil - Bilateral-Regular and Round. Motion - Bilateral-EOMI.  Chest and Lung Exam Auscultation Breath sounds - clear at anterior chest wall and clear at posterior chest wall. Adventitious sounds - No Adventitious sounds.  Cardiovascular Auscultation Rhythm - Regular rate and rhythm. Heart Sounds - S1 WNL and S2 WNL. Murmurs & Other Heart Sounds - Auscultation of the heart reveals - No Murmurs.  Abdomen Inspection Contour - Generalized moderate  distention. Palpation/Percussion Tenderness - Abdomen is non-tender to palpation. Rigidity (guarding) - Abdomen is soft. Auscultation Auscultation of the abdomen reveals - Bowel sounds normal.  Female Genitourinary Note: Not done, not pertinent to present illness   Musculoskeletal Note: She is alert and oriented, in no apparent distress. Her left hip has normal range of motion with no discomfort. Right hip can be flexed to about 100, rotate in 10, out 20, abduct to 20 with slight discomfort on range of motion. She has an antalgic gait pattern on the right.  Assessment & Plan  Primary osteoarthritis of right hip (M16.11)  Note:Surgical Plans: Right Total Hip Replacement - Anterior Approach  Disposition: Home  PCP: Dr. Shelia Media  IV TXA  Anesthesia Issues: Nausea in the past  Signed electronically by Ok Edwards, III PA-C

## 2016-08-01 ENCOUNTER — Encounter (HOSPITAL_COMMUNITY): Payer: Self-pay | Admitting: *Deleted

## 2016-08-01 ENCOUNTER — Inpatient Hospital Stay (HOSPITAL_COMMUNITY)
Admission: RE | Admit: 2016-08-01 | Discharge: 2016-08-02 | DRG: 470 | Disposition: A | Discharge status: Home Health | Payer: Medicare Other | Source: Ambulatory Visit | Attending: Orthopedic Surgery | Admitting: Orthopedic Surgery

## 2016-08-01 ENCOUNTER — Inpatient Hospital Stay (HOSPITAL_COMMUNITY): Payer: Medicare Other

## 2016-08-01 ENCOUNTER — Inpatient Hospital Stay (HOSPITAL_COMMUNITY): Payer: Medicare Other | Admitting: Anesthesiology

## 2016-08-01 ENCOUNTER — Encounter (HOSPITAL_COMMUNITY)
Admission: RE | Disposition: A | Discharge status: Home Health | Payer: Self-pay | Source: Ambulatory Visit | Attending: Orthopedic Surgery

## 2016-08-01 DIAGNOSIS — K219 Gastro-esophageal reflux disease without esophagitis: Secondary | ICD-10-CM | POA: Diagnosis present

## 2016-08-01 DIAGNOSIS — Z6838 Body mass index (BMI) 38.0-38.9, adult: Secondary | ICD-10-CM | POA: Diagnosis not present

## 2016-08-01 DIAGNOSIS — Z96649 Presence of unspecified artificial hip joint: Secondary | ICD-10-CM

## 2016-08-01 DIAGNOSIS — Z91048 Other nonmedicinal substance allergy status: Secondary | ICD-10-CM

## 2016-08-01 DIAGNOSIS — I10 Essential (primary) hypertension: Secondary | ICD-10-CM | POA: Diagnosis not present

## 2016-08-01 DIAGNOSIS — Z79899 Other long term (current) drug therapy: Secondary | ICD-10-CM | POA: Diagnosis not present

## 2016-08-01 DIAGNOSIS — M25751 Osteophyte, right hip: Secondary | ICD-10-CM | POA: Diagnosis present

## 2016-08-01 DIAGNOSIS — Z96653 Presence of artificial knee joint, bilateral: Secondary | ICD-10-CM | POA: Diagnosis present

## 2016-08-01 DIAGNOSIS — Z87891 Personal history of nicotine dependence: Secondary | ICD-10-CM | POA: Diagnosis not present

## 2016-08-01 DIAGNOSIS — E669 Obesity, unspecified: Secondary | ICD-10-CM | POA: Diagnosis present

## 2016-08-01 DIAGNOSIS — M169 Osteoarthritis of hip, unspecified: Secondary | ICD-10-CM | POA: Diagnosis present

## 2016-08-01 DIAGNOSIS — Z888 Allergy status to other drugs, medicaments and biological substances status: Secondary | ICD-10-CM

## 2016-08-01 DIAGNOSIS — Z791 Long term (current) use of non-steroidal anti-inflammatories (NSAID): Secondary | ICD-10-CM

## 2016-08-01 DIAGNOSIS — Z7982 Long term (current) use of aspirin: Secondary | ICD-10-CM

## 2016-08-01 DIAGNOSIS — G4733 Obstructive sleep apnea (adult) (pediatric): Secondary | ICD-10-CM | POA: Diagnosis present

## 2016-08-01 DIAGNOSIS — J45909 Unspecified asthma, uncomplicated: Secondary | ICD-10-CM | POA: Diagnosis present

## 2016-08-01 DIAGNOSIS — I2510 Atherosclerotic heart disease of native coronary artery without angina pectoris: Secondary | ICD-10-CM | POA: Diagnosis present

## 2016-08-01 DIAGNOSIS — Z96641 Presence of right artificial hip joint: Secondary | ICD-10-CM | POA: Diagnosis not present

## 2016-08-01 DIAGNOSIS — M25551 Pain in right hip: Secondary | ICD-10-CM | POA: Diagnosis present

## 2016-08-01 DIAGNOSIS — Z471 Aftercare following joint replacement surgery: Secondary | ICD-10-CM | POA: Diagnosis not present

## 2016-08-01 DIAGNOSIS — M1611 Unilateral primary osteoarthritis, right hip: Secondary | ICD-10-CM | POA: Diagnosis not present

## 2016-08-01 HISTORY — PX: TOTAL HIP ARTHROPLASTY: SHX124

## 2016-08-01 LAB — TYPE AND SCREEN
ABO/RH(D): A NEG
Antibody Screen: NEGATIVE

## 2016-08-01 SURGERY — ARTHROPLASTY, HIP, TOTAL, ANTERIOR APPROACH
Anesthesia: Spinal | Site: Hip | Laterality: Right

## 2016-08-01 MED ORDER — EPHEDRINE SULFATE 50 MG/ML IJ SOLN
INTRAMUSCULAR | Status: DC | PRN
  Administered 2016-08-01 (×2): 25 mg via INTRAVENOUS

## 2016-08-01 MED ORDER — METOCLOPRAMIDE HCL 5 MG/ML IJ SOLN
INTRAMUSCULAR | Status: AC
  Filled 2016-08-01: qty 2

## 2016-08-01 MED ORDER — PROPOFOL 500 MG/50ML IV EMUL
INTRAVENOUS | Status: DC | PRN
  Administered 2016-08-01: 80 mg via INTRAVENOUS

## 2016-08-01 MED ORDER — METHOCARBAMOL 1000 MG/10ML IJ SOLN
500.0000 mg | Freq: Four times a day (QID) | INTRAVENOUS | Status: DC | PRN
  Administered 2016-08-01: 500 mg via INTRAVENOUS
  Filled 2016-08-01: qty 550
  Filled 2016-08-01: qty 5

## 2016-08-01 MED ORDER — EPHEDRINE 5 MG/ML INJ
INTRAVENOUS | Status: AC
  Filled 2016-08-01: qty 10

## 2016-08-01 MED ORDER — PHENYLEPHRINE HCL 10 MG/ML IJ SOLN
INTRAMUSCULAR | Status: AC
  Filled 2016-08-01: qty 2

## 2016-08-01 MED ORDER — MIDAZOLAM HCL 2 MG/2ML IJ SOLN
INTRAMUSCULAR | Status: AC
  Filled 2016-08-01: qty 2

## 2016-08-01 MED ORDER — DEXAMETHASONE SODIUM PHOSPHATE 10 MG/ML IJ SOLN
10.0000 mg | Freq: Once | INTRAMUSCULAR | Status: AC
  Administered 2016-08-02: 10 mg via INTRAVENOUS
  Filled 2016-08-01: qty 1

## 2016-08-01 MED ORDER — DEXAMETHASONE SODIUM PHOSPHATE 10 MG/ML IJ SOLN
10.0000 mg | Freq: Once | INTRAMUSCULAR | Status: AC
  Administered 2016-08-01: 10 mg via INTRAVENOUS

## 2016-08-01 MED ORDER — ONDANSETRON HCL 4 MG/2ML IJ SOLN: 4.0000 mg | Freq: Once | INTRAMUSCULAR | Status: DC | PRN

## 2016-08-01 MED ORDER — ACETAMINOPHEN 500 MG PO TABS
1000.0000 mg | ORAL_TABLET | Freq: Four times a day (QID) | ORAL | Status: AC
  Administered 2016-08-01 – 2016-08-02 (×4): 1000 mg via ORAL
  Filled 2016-08-01 (×4): qty 2

## 2016-08-01 MED ORDER — TRAMADOL HCL 50 MG PO TABS: 50.0000 mg | ORAL_TABLET | Freq: Four times a day (QID) | ORAL | Status: DC | PRN

## 2016-08-01 MED ORDER — SODIUM CHLORIDE 0.9 % IV SOLN
INTRAVENOUS | Status: DC | PRN
  Administered 2016-08-01: 50 ug/min via INTRAVENOUS

## 2016-08-01 MED ORDER — PANTOPRAZOLE SODIUM 40 MG PO TBEC
40.0000 mg | DELAYED_RELEASE_TABLET | Freq: Every day | ORAL | Status: DC
  Administered 2016-08-02: 40 mg via ORAL
  Filled 2016-08-01: qty 1

## 2016-08-01 MED ORDER — PHENYLEPHRINE HCL 10 MG/ML IJ SOLN
INTRAMUSCULAR | Status: DC | PRN
  Administered 2016-08-01 (×2): 200 ug via INTRAVENOUS
  Administered 2016-08-01: 80 ug via INTRAVENOUS
  Administered 2016-08-01 (×2): 160 ug via INTRAVENOUS

## 2016-08-01 MED ORDER — CEFAZOLIN SODIUM-DEXTROSE 2-4 GM/100ML-% IV SOLN
2.0000 g | Freq: Four times a day (QID) | INTRAVENOUS | Status: AC
  Administered 2016-08-01 (×2): 2 g via INTRAVENOUS
  Filled 2016-08-01: qty 100

## 2016-08-01 MED ORDER — SODIUM CHLORIDE 0.9 % IV SOLN
INTRAVENOUS | Status: DC
  Administered 2016-08-01: 21:00:00 via INTRAVENOUS

## 2016-08-01 MED ORDER — BUPIVACAINE HCL (PF) 0.25 % IJ SOLN
INTRAMUSCULAR | Status: DC | PRN
  Administered 2016-08-01: 30 mL

## 2016-08-01 MED ORDER — FENTANYL CITRATE (PF) 100 MCG/2ML IJ SOLN
INTRAMUSCULAR | Status: DC | PRN
  Administered 2016-08-01: 100 ug via INTRAVENOUS

## 2016-08-01 MED ORDER — PHENYLEPHRINE 40 MCG/ML (10ML) SYRINGE FOR IV PUSH (FOR BLOOD PRESSURE SUPPORT)
PREFILLED_SYRINGE | INTRAVENOUS | Status: AC
  Filled 2016-08-01: qty 10

## 2016-08-01 MED ORDER — CEFAZOLIN SODIUM-DEXTROSE 2-4 GM/100ML-% IV SOLN
INTRAVENOUS | Status: AC
  Filled 2016-08-01: qty 100

## 2016-08-01 MED ORDER — ONDANSETRON HCL 4 MG/2ML IJ SOLN
INTRAMUSCULAR | Status: DC | PRN
  Administered 2016-08-01: 4 mg via INTRAVENOUS

## 2016-08-01 MED ORDER — BISACODYL 10 MG RE SUPP: 10.0000 mg | Freq: Every day | RECTAL | Status: DC | PRN

## 2016-08-01 MED ORDER — BUPIVACAINE HCL (PF) 0.25 % IJ SOLN
INTRAMUSCULAR | Status: AC
  Filled 2016-08-01: qty 30

## 2016-08-01 MED ORDER — TRANEXAMIC ACID 1000 MG/10ML IV SOLN
1000.0000 mg | INTRAVENOUS | Status: AC
  Administered 2016-08-01: 1000 mg via INTRAVENOUS
  Filled 2016-08-01: qty 10

## 2016-08-01 MED ORDER — ONDANSETRON HCL 4 MG/2ML IJ SOLN
INTRAMUSCULAR | Status: AC
  Filled 2016-08-01: qty 2

## 2016-08-01 MED ORDER — ONDANSETRON HCL 4 MG/2ML IJ SOLN: 4.0000 mg | Freq: Four times a day (QID) | INTRAMUSCULAR | Status: DC | PRN

## 2016-08-01 MED ORDER — METOCLOPRAMIDE HCL 5 MG/ML IJ SOLN
5.0000 mg | Freq: Three times a day (TID) | INTRAMUSCULAR | Status: DC | PRN
  Administered 2016-08-01: 10 mg via INTRAVENOUS

## 2016-08-01 MED ORDER — MORPHINE SULFATE (PF) 2 MG/ML IV SOLN
1.0000 mg | INTRAVENOUS | Status: DC | PRN
  Administered 2016-08-02: 1 mg via INTRAVENOUS
  Filled 2016-08-01: qty 1

## 2016-08-01 MED ORDER — MENTHOL 3 MG MT LOZG: 1.0000 | LOZENGE | OROMUCOSAL | Status: DC | PRN

## 2016-08-01 MED ORDER — DEXAMETHASONE SODIUM PHOSPHATE 10 MG/ML IJ SOLN
INTRAMUSCULAR | Status: AC
  Filled 2016-08-01: qty 1

## 2016-08-01 MED ORDER — PROPOFOL 500 MG/50ML IV EMUL
INTRAVENOUS | Status: DC | PRN
  Administered 2016-08-01: 80 ug/kg/min via INTRAVENOUS

## 2016-08-01 MED ORDER — ACETAMINOPHEN 10 MG/ML IV SOLN
1000.0000 mg | Freq: Once | INTRAVENOUS | Status: AC
  Administered 2016-08-01: 1000 mg via INTRAVENOUS
  Filled 2016-08-01: qty 100

## 2016-08-01 MED ORDER — AMITRIPTYLINE HCL 10 MG PO TABS
20.0000 mg | ORAL_TABLET | Freq: Every day | ORAL | Status: DC
  Administered 2016-08-01: 20 mg via ORAL
  Filled 2016-08-01 (×2): qty 2

## 2016-08-01 MED ORDER — METHOCARBAMOL 500 MG PO TABS
500.0000 mg | ORAL_TABLET | Freq: Four times a day (QID) | ORAL | Status: DC | PRN
  Administered 2016-08-01 – 2016-08-02 (×2): 500 mg via ORAL
  Filled 2016-08-01 (×2): qty 1

## 2016-08-01 MED ORDER — PROPOFOL 10 MG/ML IV BOLUS
INTRAVENOUS | Status: AC
  Filled 2016-08-01: qty 20

## 2016-08-01 MED ORDER — FLEET ENEMA 7-19 GM/118ML RE ENEM: 1.0000 | ENEMA | Freq: Once | RECTAL | Status: DC | PRN

## 2016-08-01 MED ORDER — HYDROCHLOROTHIAZIDE 12.5 MG PO CAPS
12.5000 mg | ORAL_CAPSULE | Freq: Every day | ORAL | Status: DC
  Filled 2016-08-01: qty 1

## 2016-08-01 MED ORDER — DOCUSATE SODIUM 100 MG PO CAPS
100.0000 mg | ORAL_CAPSULE | Freq: Two times a day (BID) | ORAL | Status: DC
  Filled 2016-08-01 (×2): qty 1

## 2016-08-01 MED ORDER — CEFAZOLIN SODIUM-DEXTROSE 2-4 GM/100ML-% IV SOLN
2.0000 g | INTRAVENOUS | Status: AC
  Administered 2016-08-01: 2 g via INTRAVENOUS
  Filled 2016-08-01: qty 100

## 2016-08-01 MED ORDER — LACTATED RINGERS IV SOLN
INTRAVENOUS | Status: DC
  Administered 2016-08-01 (×2): via INTRAVENOUS

## 2016-08-01 MED ORDER — ACETAMINOPHEN 10 MG/ML IV SOLN
INTRAVENOUS | Status: AC
  Filled 2016-08-01: qty 100

## 2016-08-01 MED ORDER — FENTANYL CITRATE (PF) 100 MCG/2ML IJ SOLN: 25.0000 ug | INTRAMUSCULAR | Status: DC | PRN

## 2016-08-01 MED ORDER — BUPIVACAINE IN DEXTROSE 0.75-8.25 % IT SOLN
INTRATHECAL | Status: DC | PRN
  Administered 2016-08-01: 2 mL via INTRATHECAL

## 2016-08-01 MED ORDER — PHENOL 1.4 % MT LIQD
1.0000 | OROMUCOSAL | Status: DC | PRN
  Filled 2016-08-01: qty 177

## 2016-08-01 MED ORDER — HYDROMORPHONE HCL 1 MG/ML IJ SOLN
INTRAMUSCULAR | Status: AC
  Filled 2016-08-01: qty 1

## 2016-08-01 MED ORDER — HYDROMORPHONE HCL 1 MG/ML IJ SOLN
0.2500 mg | INTRAMUSCULAR | Status: DC | PRN
Start: 2016-08-01 — End: 2016-08-01
  Administered 2016-08-01 (×2): 0.5 mg via INTRAVENOUS

## 2016-08-01 MED ORDER — DIPHENHYDRAMINE HCL 12.5 MG/5ML PO ELIX: 12.5000 mg | ORAL_SOLUTION | ORAL | Status: DC | PRN

## 2016-08-01 MED ORDER — PROPOFOL 10 MG/ML IV BOLUS
INTRAVENOUS | Status: AC
  Filled 2016-08-01: qty 60

## 2016-08-01 MED ORDER — IRBESARTAN 150 MG PO TABS
300.0000 mg | ORAL_TABLET | Freq: Every day | ORAL | Status: DC
  Filled 2016-08-01: qty 2

## 2016-08-01 MED ORDER — ACETAMINOPHEN 325 MG PO TABS: 650.0000 mg | ORAL_TABLET | Freq: Four times a day (QID) | ORAL | Status: DC | PRN

## 2016-08-01 MED ORDER — CHLORHEXIDINE GLUCONATE 4 % EX LIQD: 60.0000 mL | Freq: Once | CUTANEOUS | Status: DC

## 2016-08-01 MED ORDER — ACETAMINOPHEN 650 MG RE SUPP: 650.0000 mg | Freq: Four times a day (QID) | RECTAL | Status: DC | PRN

## 2016-08-01 MED ORDER — GABAPENTIN 400 MG PO CAPS
400.0000 mg | ORAL_CAPSULE | Freq: Every day | ORAL | Status: DC
  Administered 2016-08-01: 400 mg via ORAL
  Filled 2016-08-01: qty 1

## 2016-08-01 MED ORDER — POLYETHYLENE GLYCOL 3350 17 G PO PACK: 17.0000 g | PACK | Freq: Every day | ORAL | Status: DC | PRN

## 2016-08-01 MED ORDER — ONDANSETRON HCL 4 MG PO TABS: 4.0000 mg | ORAL_TABLET | Freq: Four times a day (QID) | ORAL | Status: DC | PRN

## 2016-08-01 MED ORDER — RIVAROXABAN 10 MG PO TABS
10.0000 mg | ORAL_TABLET | Freq: Every day | ORAL | Status: DC
  Administered 2016-08-02: 10 mg via ORAL
  Filled 2016-08-01: qty 1

## 2016-08-01 MED ORDER — METOCLOPRAMIDE HCL 5 MG PO TABS: 5.0000 mg | ORAL_TABLET | Freq: Three times a day (TID) | ORAL | Status: DC | PRN

## 2016-08-01 MED ORDER — MIDAZOLAM HCL 5 MG/5ML IJ SOLN
INTRAMUSCULAR | Status: DC | PRN
  Administered 2016-08-01: 2 mg via INTRAVENOUS

## 2016-08-01 MED ORDER — OXYCODONE HCL 5 MG PO TABS
5.0000 mg | ORAL_TABLET | ORAL | Status: DC | PRN
  Administered 2016-08-01: 10 mg via ORAL
  Administered 2016-08-01: 5 mg via ORAL
  Administered 2016-08-01 (×2): 10 mg via ORAL
  Administered 2016-08-01: 5 mg via ORAL
  Administered 2016-08-02 (×2): 10 mg via ORAL
  Filled 2016-08-01 (×2): qty 2
  Filled 2016-08-01 (×2): qty 1
  Filled 2016-08-01 (×4): qty 2

## 2016-08-01 MED ORDER — FENTANYL CITRATE (PF) 100 MCG/2ML IJ SOLN
INTRAMUSCULAR | Status: AC
  Filled 2016-08-01: qty 2

## 2016-08-01 MED ORDER — PHENYLEPHRINE HCL 10 MG/ML IJ SOLN: INTRAMUSCULAR | Status: DC | PRN

## 2016-08-01 MED ORDER — PHENYLEPHRINE 40 MCG/ML (10ML) SYRINGE FOR IV PUSH (FOR BLOOD PRESSURE SUPPORT)
PREFILLED_SYRINGE | INTRAVENOUS | Status: AC
  Filled 2016-08-01: qty 20

## 2016-08-01 MED ORDER — IRBESARTAN-HYDROCHLOROTHIAZIDE 300-12.5 MG PO TABS: 1.0000 | ORAL_TABLET | Freq: Every day | ORAL | Status: DC

## 2016-08-01 MED ORDER — LIDOCAINE 2% (20 MG/ML) 5 ML SYRINGE
INTRAMUSCULAR | Status: AC
  Filled 2016-08-01: qty 5

## 2016-08-01 MED ORDER — TRANEXAMIC ACID 1000 MG/10ML IV SOLN
1000.0000 mg | Freq: Once | INTRAVENOUS | Status: AC
  Administered 2016-08-01: 1000 mg via INTRAVENOUS
  Filled 2016-08-01: qty 10

## 2016-08-01 SURGICAL SUPPLY — 36 items
BAG DECANTER FOR FLEXI CONT (MISCELLANEOUS) ×2 IMPLANT
BAG SPEC THK2 15X12 ZIP CLS (MISCELLANEOUS)
BAG ZIPLOCK 12X15 (MISCELLANEOUS) IMPLANT
BLADE SAG 18X100X1.27 (BLADE) ×2 IMPLANT
CAPT HIP TOTAL 2 ×1 IMPLANT
CLOTH BEACON ORANGE TIMEOUT ST (SAFETY) ×2 IMPLANT
COVER PERINEAL POST (MISCELLANEOUS) ×2 IMPLANT
DECANTER SPIKE VIAL GLASS SM (MISCELLANEOUS) ×2 IMPLANT
DRAPE STERI IOBAN 125X83 (DRAPES) ×2 IMPLANT
DRAPE U-SHAPE 47X51 STRL (DRAPES) ×4 IMPLANT
DRSG ADAPTIC 3X8 NADH LF (GAUZE/BANDAGES/DRESSINGS) ×2 IMPLANT
DRSG MEPILEX BORDER 4X4 (GAUZE/BANDAGES/DRESSINGS) ×2 IMPLANT
DRSG MEPILEX BORDER 4X8 (GAUZE/BANDAGES/DRESSINGS) ×2 IMPLANT
DURAPREP 26ML APPLICATOR (WOUND CARE) ×2 IMPLANT
ELECT REM PT RETURN 9FT ADLT (ELECTROSURGICAL) ×2
ELECTRODE REM PT RTRN 9FT ADLT (ELECTROSURGICAL) ×1 IMPLANT
EVACUATOR 1/8 PVC DRAIN (DRAIN) ×2 IMPLANT
GLOVE BIO SURGEON STRL SZ7.5 (GLOVE) ×2 IMPLANT
GLOVE BIO SURGEON STRL SZ8 (GLOVE) ×4 IMPLANT
GLOVE BIOGEL PI IND STRL 8 (GLOVE) ×2 IMPLANT
GLOVE BIOGEL PI INDICATOR 8 (GLOVE) ×2
GOWN STRL REUS W/TWL LRG LVL3 (GOWN DISPOSABLE) ×2 IMPLANT
GOWN STRL REUS W/TWL XL LVL3 (GOWN DISPOSABLE) ×2 IMPLANT
PACK ANTERIOR HIP CUSTOM (KITS) ×2 IMPLANT
STRIP CLOSURE SKIN 1/2X4 (GAUZE/BANDAGES/DRESSINGS) ×2 IMPLANT
SUT ETHIBOND NAB CT1 #1 30IN (SUTURE) ×2 IMPLANT
SUT MNCRL AB 4-0 PS2 18 (SUTURE) ×2 IMPLANT
SUT STRATAFIX 0 PDS 27 VIOLET (SUTURE) ×4
SUT VIC AB 2-0 CT1 27 (SUTURE) ×4
SUT VIC AB 2-0 CT1 TAPERPNT 27 (SUTURE) ×2 IMPLANT
SUT VLOC 180 0 24IN GS25 (SUTURE) ×1 IMPLANT
SUTURE STRATFX 0 PDS 27 VIOLET (SUTURE) IMPLANT
SYR 50ML LL SCALE MARK (SYRINGE) IMPLANT
TRAY FOLEY W/METER SILVER 14FR (SET/KITS/TRAYS/PACK) ×1 IMPLANT
TRAY FOLEY W/METER SILVER 16FR (SET/KITS/TRAYS/PACK) ×1 IMPLANT
YANKAUER SUCT BULB TIP 10FT TU (MISCELLANEOUS) ×2 IMPLANT

## 2016-08-01 NOTE — Interval H&P Note (Signed)
History and Physical Interval Note:  08/01/2016 8:11 AM  Sandra Stevens  has presented today for surgery, with the diagnosis of RIGHT HIP OA  The various methods of treatment have been discussed with the patient and family. After consideration of risks, benefits and other options for treatment, the patient has consented to  Procedure(s): RIGHT TOTAL HIP ARTHROPLASTY ANTERIOR APPROACH (Right) as a surgical intervention .  The patient's history has been reviewed, patient examined, no change in status, stable for surgery.  I have reviewed the patient's chart and labs.  Questions were answered to the patient's satisfaction.     Gearlean Alf

## 2016-08-01 NOTE — Transfer of Care (Signed)
Immediate Anesthesia Transfer of Care Note  Patient: Sandra Stevens  Procedure(s) Performed: Procedure(s): RIGHT TOTAL HIP ARTHROPLASTY ANTERIOR APPROACH (Right)  Patient Location: PACU  Anesthesia Type:General  Level of Consciousness: awake, alert  and oriented  Airway & Oxygen Therapy: Patient connected to face mask oxygen  Post-op Assessment: Report given to RN and Post -op Vital signs reviewed and stable  Post vital signs: Reviewed and stable  Last Vitals:  Vitals:   08/01/16 0650  BP: 139/86  Pulse: 88  Resp: 16  Temp: 36.9 C    Last Pain:  Vitals:   08/01/16 0720  TempSrc:   PainSc: 5       Patients Stated Pain Goal: 3 ([id number])  Complications: No apparent anesthesia complications

## 2016-08-01 NOTE — H&P (View-Only) (Signed)
Sandra Stevens DOB: [birth year] Married / Language: English / Race: White Female Date of Admission:  08/01/2016 History of Present Illness  The patient is a 65 year old female who comes in  for a preoperative History and Physical. The patient is scheduled for a right total hip arthroplasty (anterior) to be performed by Dr. Dione Plover. Aluisio, MD at [facility] on 08/01/2016. The patient is a 65 year old female who is being followed for their right hip pain and osteoarthritis. They are months out from intra-articular injection. Symptoms reported include: pain, aching and catching. The patient feels that they are doing poorly and report their pain level to be moderate to severe. Current treatment includes: NSAIDs (Meloxicam; has helped). The following medication has been used for pain control: Tylenol (as needed at night). The patient has not gotten any relief of their symptoms with Cortisone injections (may have helped for a few days). She feels that over the past year she has had a significant increase in pain and decrease in function with regards to that right hip. She used to be an avid walker and states that she cannot get out and walk anymore because of the right hip. Subsequent to that, she has gained some weight. The hip is hurting with most activities and is now starting to hurt at night. The last injection did not provide as much benefit. She would llike to get the hip replaced at this time. They have been treated conservatively in the past for the above stated problem and despite conservative measures, they continue to have progressive pain and severe functional limitations and dysfunction. They have failed non-operative management including home exercise, medications, and injections. It is felt that they would benefit from undergoing total joint replacement. Risks and benefits of the procedure have been discussed with the patient and they elect to proceed with surgery. There are no active  contraindications to surgery such as ongoing infection or rapidly progressive neurological disease.   Problem List/Past Medical  Lumbar facet joint pain (M54.5)  Arthralgia of right hip (M25.551)  Trochanteric bursitis of right hip (M70.61)  Primary osteoarthritis of right hip (M16.11)  Primary osteoarthritis of lumbar spine (M47.816)  Asthma  Sleep Apnea  Hypertension  Varicose veins  Gastroesophageal Reflux Disease  Hemorrhoids  Cystitis  Past History Degenerative Disc Disease  Bursitis  Measles  Mumps  Rubella  Allergies Thimerosal *Antiseptics & Disinfectants**   Family History  Hip Replacements  Father.  Social History  Tobacco use  Former smoker. Alcohol use  Occasional alcohol use. one drink per week Children  One daughter [place] Living situation  Lives with spouse.  Medication History  Amitriptyline HCl (10MG  Tablet, 20 mg Oral) Active. Gabapentin (Oral) Specific strength unknown - Active. Calcium Carbonate (600MG  Tablet, Oral) Active. Pantoprazole Sodium (40MG  Tablet DR, Oral) Active. Vitamin D (1000UNIT Tablet, Oral) Active. Multivitamin Adult (Oral) Active. Aspirin (81MG  Tablet, 1 (one) Oral) Active. Meloxicam (7.5MG  Tablet, 1 (one) Oral po bid, Taken starting 05/07/2016) Active. Irbesartan-Hydrochlorothiazide (300-12.5MG  Tablet, Oral) Active. Tylenol (Oral as needed) Specific strength unknown - Active.  Past Surgical History  Total Knee Replacement - Both     Review of Systems General Not Present- Chills, Fatigue, Fever, Memory Loss, Night Sweats, Weight Gain and Weight Loss. Skin Not Present- Eczema, Hives, Itching, Lesions and Rash. HEENT Not Present- Dentures, Double Vision, Headache, Hearing Loss, Tinnitus and Visual Loss. Respiratory Not Present- Allergies, Chronic Cough, Coughing up blood,  Shortness of breath at rest and Shortness of breath  with exertion. Cardiovascular Not Present- Chest Pain, Difficulty Breathing Lying Down, Murmur, Palpitations, Racing/skipping heartbeats and Swelling. Gastrointestinal Not Present- Abdominal Pain, Bloody Stool, Constipation, Diarrhea, Difficulty Swallowing, Heartburn, Jaundice, Loss of appetitie, Nausea and Vomiting. Female Genitourinary Present- Urinary frequency and Urinating at Night. Not Present- Blood in Urine, Discharge, Flank Pain, Incontinence, Painful Urination, Urgency, Urinary Retention and Weak urinary stream. Musculoskeletal Present- Back Pain and Joint Pain. Not Present- Joint Swelling, Morning Stiffness, Muscle Pain, Muscle Weakness and Spasms. Neurological Not Present- Blackout spells, Difficulty with balance, Dizziness, Paralysis, Tremor and Weakness. Psychiatric Present- Insomnia.  Vitals Weight: 232 lb Height: 66in Weight was reported by patient. Height was reported by patient. Body Surface Area: 2.13 m Body Mass Index: 37.45 kg/m  Pulse: 64 (Regular)  BP: 112/64 (Sitting, Right Arm, Standard)  Physical Exam  General Mental Status -Alert, cooperative and good historian. General Appearance-pleasant, Not in acute distress. Orientation-Oriented X3. Build & Nutrition-Well nourished and Well developed.  Head and Neck Head-normocephalic, atraumatic . Neck Global Assessment - supple, no bruit auscultated on the right, no bruit auscultated on the left.  Eye Pupil - Bilateral-Regular and Round. Motion - Bilateral-EOMI.  Chest and Lung Exam Auscultation Breath sounds - clear at anterior chest wall and clear at posterior chest wall. Adventitious sounds - No Adventitious sounds.  Cardiovascular Auscultation Rhythm - Regular rate and rhythm. Heart Sounds - S1 WNL and S2 WNL. Murmurs & Other Heart Sounds - Auscultation of the heart reveals - No Murmurs.  Abdomen Inspection Contour - Generalized moderate  distention. Palpation/Percussion Tenderness - Abdomen is non-tender to palpation. Rigidity (guarding) - Abdomen is soft. Auscultation Auscultation of the abdomen reveals - Bowel sounds normal.  Female Genitourinary Note: Not done, not pertinent to present illness   Musculoskeletal Note: She is alert and oriented, in no apparent distress. Her left hip has normal range of motion with no discomfort. Right hip can be flexed to about 100, rotate in 10, out 20, abduct to 20 with slight discomfort on range of motion. She has an antalgic gait pattern on the right.  Assessment & Plan  Primary osteoarthritis of right hip (M16.11)  Note:Surgical Plans: Right Total Hip Replacement - Anterior Approach  Disposition: Home  PCP: Dr. Shelia Media  IV TXA  Anesthesia Issues: Nausea in the past  Signed electronically by Ok Edwards, III PA-C

## 2016-08-01 NOTE — Anesthesia Procedure Notes (Signed)
Spinal  Patient location during procedure: OR Staffing Anesthesiologist: Lauretta Grill Performed: anesthesiologist  Preanesthetic Checklist Completed: patient identified, site marked, surgical consent, pre-op evaluation, timeout performed, IV checked, risks and benefits discussed and monitors and equipment checked Spinal Block Patient position: sitting Prep: DuraPrep Patient monitoring: continuous pulse ox, blood pressure and heart rate Approach: midline Injection technique: single-shot Needle Needle type: Spinocan  Needle gauge: 24 G Needle length: 9 cm Additional Notes Functioning IV was confirmed and monitors were applied. Sterile prep and drape, including hand hygiene, mask and sterile gloves were used. The patient was positioned and the spine was prepped. The skin was anesthetized with lidocaine.  Free flow of clear CSF was obtained prior to injecting local anesthetic into the CSF.  The spinal needle aspirated freely following injection.  The needle was carefully withdrawn.  The patient tolerated the procedure well. Consent was obtained prior to procedure with all questions answered and concerns addressed. Risks including but not limited to bleeding, infection, nerve damage, paralysis, failed block, inadequate analgesia, allergic reaction, high spinal, itching and headache were discussed and the patient wished to proceed.   Lauretta Grill, MD

## 2016-08-01 NOTE — Anesthesia Postprocedure Evaluation (Signed)
Anesthesia Post Note  Patient: Sandra Stevens  Procedure(s) Performed: Procedure(s) (LRB): RIGHT TOTAL HIP ARTHROPLASTY ANTERIOR APPROACH (Right)  Patient location during evaluation: PACU Anesthesia Type: Spinal Level of consciousness: oriented and awake and alert Pain management: pain level controlled Vital Signs Assessment: post-procedure vital signs reviewed and stable Respiratory status: spontaneous breathing, respiratory function stable and patient connected to nasal cannula oxygen Cardiovascular status: blood pressure returned to baseline and stable Postop Assessment: no headache, no backache and spinal receding Anesthetic complications: no    Last Vitals:  Vitals:   08/01/16 1530 08/01/16 1635  BP: 123/64 122/71  Pulse: 73 74  Resp: 16 15  Temp: 36.4 C 36.7 C    Last Pain:  Vitals:   08/01/16 1751  TempSrc:   PainSc: 5                  Hadessah Grennan JENNETTE

## 2016-08-01 NOTE — Op Note (Signed)
OPERATIVE REPORT- TOTAL HIP ARTHROPLASTY   PREOPERATIVE DIAGNOSIS: Osteoarthritis of the Right hip.   POSTOPERATIVE DIAGNOSIS: Osteoarthritis of the Right  hip.   PROCEDURE: Right total hip arthroplasty, anterior approach.   SURGEON: Gaynelle Arabian, MD   ASSISTANT: [name], PA-C  ANESTHESIA:  Spinal  ESTIMATED BLOOD LOSS:-500 ml   DRAINS: Hemovac x1.   COMPLICATIONS: None   CONDITION: PACU - hemodynamically stable.   BRIEF CLINICAL NOTE: Sandra Stevens is a 65 y.o. female who has advanced end-  stage arthritis of their Right  hip with progressively worsening pain and  dysfunction.The patient has failed nonoperative management and presents for  total hip arthroplasty.   PROCEDURE IN DETAIL: After successful administration of spinal  anesthetic, the traction boots for the [facility] bed were placed on both  feet and the patient was placed onto the [facility] bed, boots placed into the leg  holders. The Right hip was then isolated from the perineum with plastic  drapes and prepped and draped in the usual sterile fashion. ASIS and  greater trochanter were marked and a oblique incision was made, starting  at about 1 cm lateral and 2 cm distal to the ASIS and coursing towards  the anterior cortex of the femur. The skin was cut with a 10 blade  through subcutaneous tissue to the level of the fascia overlying the  tensor fascia lata muscle. The fascia was then incised in line with the  incision at the junction of the anterior third and posterior 2/3rd. The  muscle was teased off the fascia and then the interval between the TFL  and the rectus was developed. The Hohmann retractor was then placed at  the top of the femoral neck over the capsule. The vessels overlying the  capsule were cauterized and the fat on top of the capsule was removed.  A Hohmann retractor was then placed anterior underneath the rectus  femoris to give exposure to the entire anterior capsule. A T-shaped   capsulotomy was performed. The edges were tagged and the femoral head  was identified.       Osteophytes are removed off the superior acetabulum.  The femoral neck was then cut in situ with an oscillating saw. Traction  was then applied to the left lower extremity utilizing the [facility]  traction. The femoral head was then removed. Retractors were placed  around the acetabulum and then circumferential removal of the labrum was  performed. Osteophytes were also removed. Reaming starts at 45 mm to  medialize and  Increased in 2 mm increments to 47 mm. We reamed in  approximately 40 degrees of abduction, 20 degrees anteversion. A 48 mm  pinnacle acetabular shell was then impacted in anatomic position under  fluoroscopic guidance with excellent purchase. We did not need to place  any additional dome screws. A 28 mm neutral + 4 marathon liner was then  placed into the acetabular shell.       The femoral lift was then placed along the lateral aspect of the femur  just distal to the vastus ridge. The leg was  externally rotated and capsule  was stripped off the inferior aspect of the femoral neck down to the  level of the lesser trochanter, this was done with electrocautery. The femur was lifted after this was performed. The  leg was then placed in an extended and adducted position essentially delivering the femur. We also removed the capsule superiorly and the piriformis from the piriformis fossa  to gain excellent exposure of the  proximal femur. Rongeur was used to remove some cancellous bone to get  into the lateral portion of the proximal femur for placement of the  initial starter reamer. The starter broaches was placed  the starter broach  and was shown to go down the center of the canal. Broaching  with the  Corail system was then performed starting at size 8, coursing  Up to size 11. A size 11 had excellent torsional and rotational  and axial stability. The trial standard offset neck was then  placed  with a 28 + 1.5 trial head. The hip was then reduced. We confirmed that  the stem was in the canal both on AP and lateral x-rays. It also has excellent sizing. The hip was reduced with outstanding stability through full extension and full external rotation.. AP pelvis was taken and the leg lengths were measured and found to be equal. Hip was then dislocated again and the femoral head and neck removed. The  femoral broach was removed. Size 11 Corail stem with a standard offset  neck was then impacted into the femur following native anteversion. Has  excellent purchase in the canal. Excellent torsional and rotational and  axial stability. It is confirmed to be in the canal on AP and lateral  fluoroscopic views. The 28 + 1.5 ceramic head was placed and the hip  reduced with outstanding stability. Again AP pelvis was taken and it  confirmed that the leg lengths were equal. The wound was then copiously  irrigated with saline solution and the capsule reattached and repaired  with Ethibond suture. 30 ml of .25% Bupivicaine was  injected into the capsule and into the edge of the tensor fascia lata as well as subcutaneous tissue. The fascia overlying the tensor fascia lata was then closed with a running #1 V-Loc. Subcu was closed with interrupted 2-0 Vicryl and subcuticular running 4-0 Monocryl. Incision was cleaned  and dried. Steri-Strips and a bulky sterile dressing applied. Hemovac  drain was hooked to suction and then the patient was awakened and transported to  recovery in stable condition.        Please note that a surgical assistant was a medical necessity for this procedure to perform it in a safe and expeditious manner. Assistant was necessary to provide appropriate retraction of vital neurovascular structures and to prevent femoral fracture and allow for anatomic placement of the prosthesis.  Gaynelle Arabian, M.D.

## 2016-08-02 LAB — BASIC METABOLIC PANEL
ANION GAP: 8 (ref 5–15)
BUN: 16 mg/dL (ref 6–20)
CALCIUM: 9 mg/dL (ref 8.9–10.3)
CHLORIDE: 101 mmol/L (ref 101–111)
CO2: 27 mmol/L (ref 22–32)
CREATININE: 0.8 mg/dL (ref 0.44–1.00)
GFR calc non Af Amer: >60 mL/min (ref >60)
GLUCOSE: 135 mg/dL — AB (ref 65–99)
Potassium: 4.1 mmol/L (ref 3.5–5.1)
Sodium: 136 mmol/L (ref 135–145)

## 2016-08-02 LAB — CBC
HEMATOCRIT: 38.9 % (ref 36.0–46.0)
HEMOGLOBIN: 12.8 g/dL (ref 12.0–15.0)
MCH: 30 pg (ref 26.0–34.0)
MCHC: 32.9 g/dL (ref 30.0–36.0)
MCV: 91.3 fL (ref 78.0–100.0)
Platelets: 227 10*3/uL (ref 150–400)
RBC: 4.26 MIL/uL (ref 3.87–5.11)
RDW: 14.1 % (ref 11.5–15.5)
WBC: 18.9 10*3/uL — ABNORMAL HIGH (ref 4.0–10.5)

## 2016-08-02 MED ORDER — RIVAROXABAN 10 MG PO TABS: 10.0000 mg | ORAL_TABLET | Freq: Every day | ORAL | 0 refills | Status: DC

## 2016-08-02 MED ORDER — OXYCODONE HCL 5 MG PO TABS: 5.0000 mg | ORAL_TABLET | ORAL | 0 refills | Status: DC | PRN

## 2016-08-02 MED ORDER — METHOCARBAMOL 500 MG PO TABS: 500.0000 mg | ORAL_TABLET | Freq: Four times a day (QID) | ORAL | 0 refills | Status: DC | PRN

## 2016-08-02 MED ORDER — SODIUM CHLORIDE 0.9 % IV BOLUS (SEPSIS): 500.0000 mL | Freq: Once | INTRAVENOUS | Status: DC

## 2016-08-02 MED ORDER — TRAMADOL HCL 50 MG PO TABS: 50.0000 mg | ORAL_TABLET | Freq: Four times a day (QID) | ORAL | 1 refills | Status: DC | PRN

## 2016-08-02 MED ORDER — ONDANSETRON HCL 4 MG PO TABS: 4.0000 mg | ORAL_TABLET | Freq: Four times a day (QID) | ORAL | 0 refills | Status: DC | PRN

## 2016-08-02 NOTE — Progress Notes (Signed)
Physical Therapy Treatment Patient Details Name: Sandra Stevens MRN: [id number] DOB: [birth year] Today's Date: 08/02/2016    History of Present Illness Pt s/p R THR with hx of Bil TKR    PT Comments    Marked improvement in activity tolerance with no c/o dizziness this pm.  Reviewed stairs and car transfers.  Follow Up Recommendations  Home health PT     Equipment Recommendations  Crutches    Recommendations for Other Services OT consult     Precautions / Restrictions Precautions Precautions: Fall Restrictions Weight Bearing Restrictions: No Other Position/Activity Restrictions: WBAT    Mobility  Bed Mobility Overal bed mobility: Needs Assistance Bed Mobility: Supine to Sit;Sit to Supine     Supine to sit: Min guard Sit to supine: Min assist   General bed mobility comments: cues for sequence and use of L LE to self assist  Transfers Overall transfer level: Needs assistance Equipment used: Rolling walker (2 wheeled) Transfers: Sit to/from Stand Sit to Stand: Supervision         General transfer comment: cues for LE management and use of UEs to self assist  Ambulation/Gait Ambulation/Gait assistance: Min guard;Supervision Ambulation Distance (Feet): 123 Feet Assistive device: Rolling walker (2 wheeled) Gait Pattern/deviations: Step-to pattern;Step-through pattern;Decreased step length - right;Decreased step length - left;Shuffle;Trunk flexed Gait velocity: decr Gait velocity interpretation: Below normal speed for age/gender General Gait Details: cues for posture, position from RW and initial sequence.     Stairs Stairs: Yes Stairs assistance: Min assist Stair Management: One rail Right;Forwards;Step to pattern;With crutches Number of Stairs: 5 General stair comments: cues for sequence and foot/crutch placement  Wheelchair Mobility    Modified Rankin (Stroke Patients Only)       Balance                                     Cognition Arousal/Alertness: Awake/alert Behavior During Therapy: WFL for tasks assessed/performed Overall Cognitive Status: Within Functional Limits for tasks assessed                      Exercises      General Comments        Pertinent Vitals/Pain Pain Assessment: 0-10 Pain Score: 4  Pain Location: R hip Pain Descriptors / Indicators: Aching;Sore Pain Intervention(s): Limited activity within patient's tolerance;Monitored during session;Ice applied    Home Living                      Prior Function            PT Goals (current goals can now be found in the care plan section) Acute Rehab PT Goals Patient Stated Goal: Regain IND PT Goal Formulation: With patient Time For Goal Achievement: 08/04/16 Potential to Achieve Goals: Good Progress towards PT goals: Progressing toward goals    Frequency    7X/week      PT Plan Current plan remains appropriate    Co-evaluation             End of Session Equipment Utilized During Treatment: Gait belt Activity Tolerance: Patient tolerated treatment well Patient left: in bed;with call bell/phone within reach     Time: 1438-1510 PT Time Calculation (min) (ACUTE ONLY): 32 min  Charges:  $Gait Training: 8-22 mins $Therapeutic Activity: 8-22 mins  G Codes:      Sandra Stevens 05-Aug-2016, 4:37 PM

## 2016-08-02 NOTE — Care Management Note (Signed)
Case Management Note  Patient Details  Name: Sandra Stevens MRN: [id number] Date of Birth: 1950/11/26  Subjective/Objective:                  RIGHT TOTAL HIP ARTHROPLASTY ANTERIOR APPROACH (Right) Action/Plan: Discharge planning Expected Discharge Date:                  Expected Discharge Plan:  [place]  In-House Referral:     Discharge planning Services  CM Consult  Post Acute Care Choice:  Home Health Choice offered to:  Patient  DME Arranged:  Hospital bed DME Agency:  [place]:  PT [place]:  [facility])  Status of Service:  Completed, signed off  If discussed at [employer] of Stay Meetings, dates discussed:    Additional Comments: CM met with pt in room to offer choice of home health agency.  Pt chooses [facility] and specifically Jackelyn Poling as her PT.  Referral given to [facility] rep, Tim with request for [employer].  CM notifed [facility] DME rep, Jermaine concerning discharge and need for delivery of Hospital Bed to home for pt; Jermaine confirms bed has been arranged.  No other CM needs were communicated. Dellie Catholic, RN 08/02/2016, 1:49 PM

## 2016-08-02 NOTE — Progress Notes (Signed)
OT Cancellation Note  Patient Details Name: Sandra Stevens MRN: [id number] DOB: [birth year]   Cancelled Treatment:    Reason Eval/Treat Not Completed: PT screened, no needs identified, will sign off.  Pt has h/o both TKAs.    Jason Hauge 08/02/2016, 11:36 AM  Lesle Chris, OTR/L [phone] 08/02/2016

## 2016-08-02 NOTE — Progress Notes (Signed)
Subjective: 1 Day Post-Op Procedure(s) (LRB): RIGHT TOTAL HIP ARTHROPLASTY ANTERIOR APPROACH (Right) Patient reports pain as mild.   Patient seen in rounds by Dr. Wynelle Stevens. Patient is well, but has had some minor complaints of pain in the hip, requiring pain medications We will start therapy today.  If they do well with therapy and meets all goals, then will allow home later this afternoon following therapy. Plan is to go Home after hospital stay.  Objective: Vital signs in last 24 hours: Temp:  [97.5 F (36.4 C)-98.7 F (37.1 C)] 98.7 F (37.1 C) (10/12 0526) Pulse Rate:  [64-80] 77 (10/12 0526) Resp:  [10-20] 16 (10/12 0526) BP: (103-138)/(55-77) 138/69 (10/12 0526) SpO2:  [98 %-100 %] 100 % (10/12 0526) Weight:  [108.4 kg (239 lb)] 108.4 kg (239 lb) (10/11 1330)  Intake/Output from previous day:  Intake/Output Summary (Last 24 hours) at 08/02/16 0742 Last data filed at 08/02/16 0547  Gross per 24 hour  Intake             3800 ml  Output             2020 ml  Net             1780 ml    Intake/Output this shift: No intake/output data recorded.  Labs:  Recent Labs  08/02/16 0435  HGB 12.8    Recent Labs  08/02/16 0435  WBC 18.9*  RBC 4.26  HCT 38.9  PLT 227    Recent Labs  08/02/16 0435  NA 136  K 4.1  CL 101  CO2 27  BUN 16  CREATININE 0.80  GLUCOSE 135*  CALCIUM 9.0   No results for input(s): LABPT, INR in the last 72 hours.  EXAM General - Patient is Alert, Appropriate and Oriented Extremity - Neurovascular intact Sensation intact distally Dressing - dressing C/D/I Motor Function - intact, moving foot and toes well on exam.  Hemovac pulled without difficulty.  Past Medical History:  Diagnosis Date  . Arthritis    hx. arthritis, DDD, bursitis(hips)  . Coronary artery calcification   . Cystitis    past hx none recently.  Marland Kitchen GERD (gastroesophageal reflux disease)    control by med.  . Hemorrhoid    not a problem  . HTN (hypertension)     no meds required per Dr Linna Darner  . OSA on CPAP    mild OSA, CPAP of 7 cm since 2009?   Marland Kitchen PONV (postoperative nausea and vomiting)    just nausea  . Sleep apnea    on cpap. 07-23-16 States no longer using cpap-no machine"was told didn't have sleep apnea anymore  . SVD (spontaneous vaginal delivery)    x 1  . Varicose vein of leg    not a problem    Assessment/Plan: 1 Day Post-Op Procedure(s) (LRB): RIGHT TOTAL HIP ARTHROPLASTY ANTERIOR APPROACH (Right) Principal Problem:   OA (osteoarthritis) of hip  Estimated body mass index is 38.58 kg/m as calculated from the following:   Height as of this encounter: 5\' 6"  (1.676 m).   Weight as of this encounter: 108.4 kg (239 lb). Advance diet Up with therapy Discharge home with home health  DVT Prophylaxis - Xarelto Weight Bearing As Tolerated right Leg Hemovac Pulled Begin Therapy  If meets goals and able to go home: Up with therapy Discharge home with home health Diet - Cardiac diet Follow up - in 2 weeks Activity - WBAT Disposition - Home Condition Upon Discharge - pending  therapy results D/C Meds - See DC Summary DVT Prophylaxis - Xarelto  Sandra Muslim, PA-C Orthopaedic Surgery 08/02/2016, 7:42 AM

## 2016-08-02 NOTE — Progress Notes (Signed)
Physical Therapy Treatment Patient Details Name: Sandra Stevens MRN: [id number] DOB: 1951-01-21 Today's Date: 2016-08-14    History of Present Illness Pt s/p R THR with hx of Bil TKR    PT Comments    Pt motivated but ltd ambulation 2* dizziness, RN aware.  Follow Up Recommendations  Home health PT     Equipment Recommendations  Crutches    Recommendations for Other Services OT consult     Precautions / Restrictions Precautions Precautions: Fall Restrictions Weight Bearing Restrictions: No Other Position/Activity Restrictions: WBAT    Mobility  Bed Mobility Overal bed mobility: Needs Assistance Bed Mobility: Supine to Sit     Supine to sit: Min assist     General bed mobility comments: cues for sequence and use of L LE to self assist  Transfers Overall transfer level: Needs assistance Equipment used: Rolling walker (2 wheeled) Transfers: Sit to/from Stand Sit to Stand: Min guard         General transfer comment: cues for LE management and use of UEs to self assist  Ambulation/Gait Ambulation/Gait assistance: Min assist Ambulation Distance (Feet): 84 Feet Assistive device: Rolling walker (2 wheeled) Gait Pattern/deviations: Step-to pattern;Step-through pattern;Decreased step length - right;Decreased step length - left;Shuffle;Trunk flexed Gait velocity: decr Gait velocity interpretation: Below normal speed for age/gender General Gait Details: cues for posture, position from RW and initial sequence.  Distance ltd by onset dizziness - RN aware   Stairs            Wheelchair Mobility    Modified Rankin (Stroke Patients Only)       Balance                                    Cognition Arousal/Alertness: Awake/alert Behavior During Therapy: WFL for tasks assessed/performed Overall Cognitive Status: Within Functional Limits for tasks assessed                      Exercises      General Comments         Pertinent Vitals/Pain Pain Assessment: 0-10 Pain Score: 5  Pain Location: R hip Pain Descriptors / Indicators: Aching;Sore Pain Intervention(s): Limited activity within patient's tolerance;Monitored during session;Premedicated before session;Ice applied    Home Living                      Prior Function            PT Goals (current goals can now be found in the care plan section) Acute Rehab PT Goals Patient Stated Goal: Regain IND PT Goal Formulation: With patient Time For Goal Achievement: 08/04/16 Potential to Achieve Goals: Good Progress towards PT goals: Progressing toward goals    Frequency    7X/week      PT Plan Current plan remains appropriate    Co-evaluation             End of Session Equipment Utilized During Treatment: Gait belt Activity Tolerance: Patient tolerated treatment well;Other (comment) Patient left: in chair;with call bell/phone within reach     Time: 0955-1013 PT Time Calculation (min) (ACUTE ONLY): 18 min  Charges:  $Gait Training: 8-22 mins                    G Codes:      Sandra Stevens 14-Aug-2016, 1:09 PM

## 2016-08-02 NOTE — Discharge Instructions (Addendum)
Dr. Gaynelle Arabian Total Joint Specialist [facility] [address], [place], [place] [address] [phone] ANTERIOR APPROACH TOTAL HIP REPLACEMENT POSTOPERATIVE DIRECTIONS   Hip Rehabilitation, Guidelines Following Surgery  The results of a hip operation are greatly improved after range of motion and muscle strengthening exercises. Follow all safety measures which are given to protect your hip. If any of these exercises cause increased pain or swelling in your joint, decrease the amount until you are comfortable again. Then slowly increase the exercises. Call your caregiver if you have problems or questions.   HOME CARE INSTRUCTIONS  Remove items at home which could result in a fall. This includes throw rugs or furniture in walking pathways.   ICE to the affected hip every three hours for 30 minutes at a time and then as needed for pain and swelling.  Continue to use ice on the hip for pain and swelling from surgery. You may notice swelling that will progress down to the foot and ankle.  This is normal after surgery.  Elevate the leg when you are not up walking on it.    Continue to use the breathing machine which will help keep your temperature down.  It is common for your temperature to cycle up and down following surgery, especially at night when you are not up moving around and exerting yourself.  The breathing machine keeps your lungs expanded and your temperature down.   DIET You may resume your previous home diet once your are discharged from the hospital.  DRESSING / WOUND CARE / SHOWERING You may shower 3 days after surgery, but keep the wounds dry during showering.  You may use an occlusive plastic wrap (Press'n Seal for example), NO SOAKING/SUBMERGING IN THE BATHTUB.  If the bandage gets wet, change with a clean dry gauze.  If the incision gets wet, pat the wound dry with a clean towel. You may start showering once you are discharged home but do not  submerge the incision under water. Just pat the incision dry and apply a dry gauze dressing on daily. Change the surgical dressing daily and reapply a dry dressing each time.  ACTIVITY Walk with your walker as instructed. Use walker as long as suggested by your caregivers. Avoid periods of inactivity such as sitting longer than an hour when not asleep. This helps prevent blood clots.  You may resume a sexual relationship in one month or when given the OK by your doctor.  You may return to work once you are cleared by your doctor.  Do not drive a car for 6 weeks or until released by you surgeon.  Do not drive while taking narcotics.  WEIGHT BEARING Weight bearing as tolerated with assist device (walker, cane, etc) as directed, use it as long as suggested by your surgeon or therapist, typically at least 4-6 weeks.  POSTOPERATIVE CONSTIPATION PROTOCOL Constipation - defined medically as fewer than three stools per week and severe constipation as less than one stool per week.  One of the most common issues patients have following surgery is constipation.  Even if you have a regular bowel pattern at home, your normal regimen is likely to be disrupted due to multiple reasons following surgery.  Combination of anesthesia, postoperative narcotics, change in appetite and fluid intake all can affect your bowels.  In order to avoid complications following surgery, here are some recommendations in order to help you during your recovery period.  Colace (docusate) - Pick up an over-the-counter form  of Colace or another stool softener and take twice a day as long as you are requiring postoperative pain medications.  Take with a full glass of water daily.  If you experience loose stools or diarrhea, hold the colace until you stool forms back up.  If your symptoms do not get better within 1 week or if they get worse, check with your doctor.  Dulcolax (bisacodyl) - Pick up over-the-counter and take as directed  by the product packaging as needed to assist with the movement of your bowels.  Take with a full glass of water.  Use this product as needed if not relieved by Colace only.   MiraLax (polyethylene glycol) - Pick up over-the-counter to have on hand.  MiraLax is a solution that will increase the amount of water in your bowels to assist with bowel movements.  Take as directed and can mix with a glass of water, juice, soda, coffee, or tea.  Take if you go more than two days without a movement. Do not use MiraLax more than once per day. Call your doctor if you are still constipated or irregular after using this medication for 7 days in a row.  If you continue to have problems with postoperative constipation, please contact the office for further assistance and recommendations.  If you experience "the worst abdominal pain ever" or develop nausea or vomiting, please contact the office immediatly for further recommendations for treatment.  ITCHING  If you experience itching with your medications, try taking only a single pain pill, or even half a pain pill at a time.  You can also use Benadryl over the counter for itching or also to help with sleep.   TED HOSE STOCKINGS Wear the elastic stockings on both legs for three weeks following surgery during the day but you may remove then at night for sleeping.  MEDICATIONS See your medication summary on the After Visit Summary that the nursing staff will review with you prior to discharge.  You may have some home medications which will be placed on hold until you complete the course of blood thinner medication.  It is important for you to complete the blood thinner medication as prescribed by your surgeon.  Continue your approved medications as instructed at time of discharge.  PRECAUTIONS If you experience chest pain or shortness of breath - call 911 immediately for transfer to the hospital emergency department.  If you develop a fever greater that 101 F,  purulent drainage from wound, increased redness or drainage from wound, foul odor from the wound/dressing, or calf pain - CONTACT YOUR SURGEON.                                                   FOLLOW-UP APPOINTMENTS Make sure you keep all of your appointments after your operation with your surgeon and caregivers. You should call the office at the above phone number and make an appointment for approximately two weeks after the date of your surgery or on the date instructed by your surgeon outlined in the "After Visit Summary".  RANGE OF MOTION AND STRENGTHENING EXERCISES  These exercises are designed to help you keep full movement of your hip joint. Follow your caregiver's or physical therapist's instructions. Perform all exercises about fifteen times, three times per day or as directed. Exercise both hips, even if you have  had only one joint replacement. These exercises can be done on a training (exercise) mat, on the floor, on a table or on a bed. Use whatever works the best and is most comfortable for you. Use music or television while you are exercising so that the exercises are a pleasant break in your day. This will make your life better with the exercises acting as a break in routine you can look forward to.  Lying on your back, slowly slide your foot toward your buttocks, raising your knee up off the floor. Then slowly slide your foot back down until your leg is straight again.  Lying on your back spread your legs as far apart as you can without causing discomfort.  Lying on your side, raise your upper leg and foot straight up from the floor as far as is comfortable. Slowly lower the leg and repeat.  Lying on your back, tighten up the muscle in the front of your thigh (quadriceps muscles). You can do this by keeping your leg straight and trying to raise your heel off the floor. This helps strengthen the largest muscle supporting your knee.  Lying on your back, tighten up the muscles of your  buttocks both with the legs straight and with the knee bent at a comfortable angle while keeping your heel on the floor.   IF YOU ARE TRANSFERRED TO A SKILLED REHAB FACILITY If the patient is transferred to a skilled rehab facility following release from the hospital, a list of the current medications will be sent to the facility for the patient to continue.  When discharged from the skilled rehab facility, please have the facility set up the patient's [place] prior to being released. Also, the skilled facility will be responsible for providing the patient with their medications at time of release from the facility to include their pain medication, the muscle relaxants, and their blood thinner medication. If the patient is still at the rehab facility at time of the two week follow up appointment, the skilled rehab facility will also need to assist the patient in arranging follow up appointment in our office and any transportation needs.  MAKE SURE YOU:  Understand these instructions.  Get help right away if you are not doing well or get worse.    Pick up stool softner and laxative for home use following surgery while on pain medications. Do not submerge incision under water. Please use good hand washing techniques while changing dressing each day. May shower starting three days after surgery. Please use a clean towel to pat the incision dry following showers. Continue to use ice for pain and swelling after surgery. Do not use any lotions or creams on the incision until instructed by your surgeon.  Take Xarelto for two and a half more weeks, then discontinue Xarelto. Once the patient has completed the Xarelto, they may resume the 81 mg Aspirin. Information on my medicine - XARELTO (Rivaroxaban)  This medication education was reviewed with me or my healthcare representative as part of my discharge preparation.  The pharmacist that spoke with me during my hospital stay was:   Sallyanne Havers, Student-PharmD  Why was Xarelto prescribed for you? Xarelto was prescribed for you to reduce the risk of blood clots forming after orthopedic surgery. The medical term for these abnormal blood clots is venous thromboembolism (VTE).  What do you need to know about xarelto ? Take your Xarelto ONCE DAILY at the same time every day. You may take  it either with or without food.  If you have difficulty swallowing the tablet whole, you may crush it and mix in applesauce just prior to taking your dose.  Take Xarelto exactly as prescribed by your doctor and DO NOT stop taking Xarelto without talking to the doctor who prescribed the medication.  Stopping without other VTE prevention medication to take the place of Xarelto may increase your risk of developing a clot.  After discharge, you should have regular check-up appointments with your healthcare provider that is prescribing your Xarelto.    What do you do if you miss a dose? If you miss a dose, take it as soon as you remember on the same day then continue your regularly scheduled once daily regimen the next day. Do not take two doses of Xarelto on the same day.   Important Safety Information A possible side effect of Xarelto is bleeding. You should call your healthcare provider right away if you experience any of the following: ? Bleeding from an injury or your nose that does not stop. ? Unusual colored urine (red or dark brown) or unusual colored stools (red or black). ? Unusual bruising for unknown reasons. ? A serious fall or if you hit your head (even if there is no bleeding).  Some medicines may interact with Xarelto and might increase your risk of bleeding while on Xarelto. To help avoid this, consult your healthcare provider or pharmacist prior to using any new prescription or non-prescription medications, including herbals, vitamins, non-steroidal anti-inflammatory drugs (NSAIDs) and supplements.  This website  has more information on Xarelto: [email or web address]

## 2016-08-02 NOTE — Discharge Summary (Signed)
Physician Discharge Summary   Patient ID: Sandra Stevens MRN: [id number] DOB/AGE: 67-Jun-1952 67 y.o.  Admit date: 08/01/2016 Discharge date: 08-02-2016  Primary Diagnosis:  Osteoarthritis of the Right hip.   Admission Diagnoses:  Past Medical History:  Diagnosis Date  . Arthritis    hx. arthritis, DDD, bursitis(hips)  . Coronary artery calcification   . Cystitis    past hx none recently.  Marland Kitchen GERD (gastroesophageal reflux disease)    control by med.  . Hemorrhoid    not a problem  . HTN (hypertension)    no meds required per Dr Linna Darner  . OSA on CPAP    mild OSA, CPAP of 7 cm since 2009?   Marland Kitchen PONV (postoperative nausea and vomiting)    just nausea  . Sleep apnea    on cpap. 07-23-16 States no longer using cpap-no machine"was told didn't have sleep apnea anymore  . SVD (spontaneous vaginal delivery)    x 1  . Varicose vein of leg    not a problem   Discharge Diagnoses:   Principal Problem:   OA (osteoarthritis) of hip  Estimated body mass index is 38.58 kg/m as calculated from the following:   Height as of this encounter: '5\' 6"'  (1.676 m).   Weight as of this encounter: 108.4 kg (239 lb).  Procedure(s) (LRB): RIGHT TOTAL HIP ARTHROPLASTY ANTERIOR APPROACH (Right)   Consults: None  HPI: Sandra Stevens is a 65 y.o. female who has advanced end-  stage arthritis of their Right  hip with progressively worsening pain and  dysfunction.The patient has failed nonoperative management and presents for  total hip arthroplasty.   Laboratory Data: Admission on 08/01/2016  Component Date Value Ref Range Status  . WBC 08/02/2016 18.9* 4.0 - 10.5 K/uL Final  . RBC 08/02/2016 4.26  3.87 - 5.11 MIL/uL Final  . Hemoglobin 08/02/2016 12.8  12.0 - 15.0 g/dL Final  . HCT 08/02/2016 38.9  36.0 - 46.0 % Final  . MCV 08/02/2016 91.3  78.0 - 100.0 fL Final  . MCH 08/02/2016 30.0  26.0 - 34.0 pg Final  . MCHC 08/02/2016 32.9  30.0 - 36.0 g/dL Final  . RDW 08/02/2016 14.1  11.5 - 15.5  % Final  . Platelets 08/02/2016 227  150 - 400 K/uL Final  . Sodium 08/02/2016 136  135 - 145 mmol/L Final  . Potassium 08/02/2016 4.1  3.5 - 5.1 mmol/L Final  . Chloride 08/02/2016 101  101 - 111 mmol/L Final  . CO2 08/02/2016 27  22 - 32 mmol/L Final  . Glucose, Bld 08/02/2016 135* 65 - 99 mg/dL Final  . BUN 08/02/2016 16  6 - 20 mg/dL Final  . Creatinine, Ser 08/02/2016 0.80  0.44 - 1.00 mg/dL Final  . Calcium 08/02/2016 9.0  8.9 - 10.3 mg/dL Final  . GFR calc non Af Amer 08/02/2016 >60  >60 mL/min Final  . GFR calc Af Amer 08/02/2016 >60  >60 mL/min Final   Comment: (NOTE) The eGFR has been calculated using the CKD EPI equation. This calculation has not been validated in all clinical situations. eGFR's persistently <60 mL/min signify possible Chronic Kidney Disease.   Georgiann Hahn gap 08/02/2016 8  5 - 15 Final  Hospital Outpatient Visit on 07/23/2016  Component Date Value Ref Range Status  . MRSA, PCR 07/23/2016 NEGATIVE  NEGATIVE Final  . Staphylococcus aureus 07/23/2016 POSITIVE* NEGATIVE Final   Comment:        The Xpert SA Assay (FDA approved for  NASAL specimens in patients over 44 years of age), is one component of a comprehensive surveillance program.  Test performance has been validated by [facility] for patients greater than or equal to 74 year old. It is not intended to diagnose infection nor to guide or monitor treatment.   Marland Kitchen aPTT 07/23/2016 35  24 - 36 seconds Final  . Prothrombin Time 07/23/2016 12.8  11.4 - 15.2 seconds Final  . INR 07/23/2016 0.96   Final  . ABO/RH(D) 08/01/2016 A NEG   Final  . Antibody Screen 08/01/2016 NEG   Final  . Sample Expiration 08/01/2016 08/04/2016   Final  . Extend sample reason 08/01/2016 NO TRANSFUSIONS OR PREGNANCY IN THE PAST 3 MONTHS   Final  . ABO/RH(D) 07/23/2016 A NEG   Final     X-Rays:Dg Pelvis Portable  Result Date: 08/01/2016 CLINICAL DATA:  Status post total hip replacement on the right EXAM: PORTABLE PELVIS 1-2  VIEWS COMPARISON:  None. FINDINGS: Frontal pelvis obtained. There is a total hip prosthesis on the right with prosthetic components appearing well-seated. No acute fracture or dislocation evident. Surgical drain noted laterally on the right. There is slight narrowing of the left hip joint. IMPRESSION: Total hip prosthetic components on the right appear well seated. No acute fracture or dislocation. Slight narrowing left hip joint. Electronically Signed   By: Lowella Grip III M.D.   On: 08/01/2016 10:14   Dg C-arm 1-60 Min-no Report  Result Date: 08/01/2016 CLINICAL DATA: surgery C-ARM 1-60 MINUTES Fluoroscopy was utilized by the requesting physician.  No radiographic interpretation.    EKG: Orders placed or performed in visit on 07/23/16  . EKG 12-Lead     Hospital Course: Patient was admitted to [facility] and taken to the OR and underwent the above state procedure without complications.  Patient tolerated the procedure well and was later transferred to the recovery room and then to the orthopaedic floor for postoperative care.  They were given PO and IV analgesics for pain control following their surgery.  They were given 24 hours of postoperative antibiotics of  Anti-infectives    Start     Dose/Rate Route Frequency Ordered Stop   08/01/16 1430  ceFAZolin (ANCEF) IVPB 2g/100 mL premix     2 g 200 mL/hr over 30 Minutes Intravenous Every 6 hours 08/01/16 1124 08/01/16 2023   08/01/16 0651  ceFAZolin (ANCEF) IVPB 2g/100 mL premix     2 g 200 mL/hr over 30 Minutes Intravenous On call to O.R. 08/01/16 [id number] 08/01/16 0816     and started on DVT prophylaxis in the form of Xarelto.   PT and OT were ordered for total hip protocol.  The patient was allowed to be WBAT with therapy. Discharge planning was consulted to help with postop disposition and equipment needs.  Patient had a good night on the evening of surgery.  They started to get up OOB with therapy on day one.  Hemovac drain  was pulled without difficulty. Dressing was checked and was clean and dry.  Patient was seen in rounds by Dr. Maureen Ralphs and was setup to go home following therapy.  She did well with therapy and was ready to go home.  Discharge home with home health Diet - Cardiac diet Follow up - in 2 weeks Activity - WBAT Disposition - Home Condition Upon Discharge - improving D/C Meds - See DC Summary DVT Prophylaxis - Xarelto  Discharge Instructions    Call MD / Call 911  Complete by:  As directed    If you experience chest pain or shortness of breath, CALL 911 and be transported to the hospital emergency room.  If you develope a fever above 101 F, pus (white drainage) or increased drainage or redness at the wound, or calf pain, call your surgeon's office.   Change dressing    Complete by:  As directed    You may change your dressing dressing daily with sterile 4 x 4 inch gauze dressing and paper tape.  Do not submerge the incision under water.   Constipation Prevention    Complete by:  As directed    Drink plenty of fluids.  Prune juice may be helpful.  You may use a stool softener, such as Colace (over the counter) 100 mg twice a day.  Use MiraLax (over the counter) for constipation as needed.   Diet - low sodium heart healthy    Complete by:  As directed    Discharge instructions    Complete by:  As directed    Pick up stool softner and laxative for home use following surgery while on pain medications. Do not submerge incision under water. Please use good hand washing techniques while changing dressing each day. May shower starting three days after surgery. Please use a clean towel to pat the incision dry following showers. Continue to use ice for pain and swelling after surgery. Do not use any lotions or creams on the incision until instructed by your surgeon.   Postoperative Constipation Protocol  Constipation - defined medically as fewer than three stools per week and severe constipation  as less than one stool per week.  One of the most common issues patients have following surgery is constipation.  Even if you have a regular bowel pattern at home, your normal regimen is likely to be disrupted due to multiple reasons following surgery.  Combination of anesthesia, postoperative narcotics, change in appetite and fluid intake all can affect your bowels.  In order to avoid complications following surgery, here are some recommendations in order to help you during your recovery period.  Colace (docusate) - Pick up an over-the-counter form of Colace or another stool softener and take twice a day as long as you are requiring postoperative pain medications.  Take with a full glass of water daily.  If you experience loose stools or diarrhea, hold the colace until you stool forms back up.  If your symptoms do not get better within 1 week or if they get worse, check with your doctor.  Dulcolax (bisacodyl) - Pick up over-the-counter and take as directed by the product packaging as needed to assist with the movement of your bowels.  Take with a full glass of water.  Use this product as needed if not relieved by Colace only.   MiraLax (polyethylene glycol) - Pick up over-the-counter to have on hand.  MiraLax is a solution that will increase the amount of water in your bowels to assist with bowel movements.  Take as directed and can mix with a glass of water, juice, soda, coffee, or tea.  Take if you go more than two days without a movement. Do not use MiraLax more than once per day. Call your doctor if you are still constipated or irregular after using this medication for 7 days in a row.  If you continue to have problems with postoperative constipation, please contact the office for further assistance and recommendations.  If you experience "the worst abdominal pain ever" or  develop nausea or vomiting, please contact the office immediatly for further recommendations for treatment.   Take Xarelto for  two and a half more weeks, then discontinue Xarelto. Once the patient has completed the Xarelto, they may resume the 81 mg Aspirin.   Do not sit on low chairs, stoools or toilet seats, as it may be difficult to get up from low surfaces    Complete by:  As directed    Driving restrictions    Complete by:  As directed    No driving until released by the physician.   Increase activity slowly as tolerated    Complete by:  As directed    Lifting restrictions    Complete by:  As directed    No lifting until released by the physician.   Patient may shower    Complete by:  As directed    You may shower without a dressing once there is no drainage.  Do not wash over the wound.  If drainage remains, do not shower until drainage stops.   TED hose    Complete by:  As directed    Use stockings (TED hose) for 3 weeks on both leg(s).  You may remove them at night for sleeping.   Weight bearing as tolerated    Complete by:  As directed    Laterality:  right   Extremity:  Lower       Medication List    STOP taking these medications   aspirin 81 MG EC tablet   CALCIUM 600 PO   cholecalciferol 1000 units tablet Commonly known as:  VITAMIN D   meloxicam 7.5 MG tablet Commonly known as:  MOBIC   multivitamin tablet   omeprazole-sodium bicarbonate 40-1100 MG capsule Commonly known as:  ZEGERID   zaleplon 10 MG capsule Commonly known as:  SONATA     TAKE these medications   amitriptyline 10 MG tablet Commonly known as:  ELAVIL TAKE 2 TABLETS BY MOUTH AT BEDTIME. What changed:  See the new instructions.   furosemide 40 MG tablet Commonly known as:  LASIX TAKE 1 TABLET 3 TIMES A    WEEK   gabapentin 400 MG capsule Commonly known as:  NEURONTIN Take 1 capsule (400 mg total) by mouth 3 (three) times daily. What changed:  when to take this   irbesartan-hydrochlorothiazide 300-12.5 MG tablet Commonly known as:  AVALIDE Take 1 tablet by mouth daily.   KLOR-CON M20 20 MEQ  tablet Generic drug:  potassium chloride SA TAKE 1 TABLET 3 TIMES A    WEEK. TAKING ALONG WITH    FUROSEMIDE   losartan 100 MG tablet Commonly known as:  COZAAR TAKE 1 TABLET DAILY   methocarbamol 500 MG tablet Commonly known as:  ROBAXIN Take 1 tablet (500 mg total) by mouth every 6 (six) hours as needed for muscle spasms.   ondansetron 4 MG tablet Commonly known as:  ZOFRAN Take 1 tablet (4 mg total) by mouth every 6 (six) hours as needed for nausea.   oxyCODONE 5 MG immediate release tablet Commonly known as:  Oxy IR/ROXICODONE Take 1-2 tablets (5-10 mg total) by mouth every 3 (three) hours as needed for breakthrough pain.   pantoprazole 40 MG tablet Commonly known as:  PROTONIX Take 40 mg by mouth daily.   rivaroxaban 10 MG Tabs tablet Commonly known as:  XARELTO Take 1 tablet (10 mg total) by mouth daily with breakfast. Take Xarelto for two and a half more weeks, then discontinue Xarelto. Once  the patient has completed the Xarelto, they may resume the 81 mg Aspirin. Start taking on:  08/03/2016   traMADol 50 MG tablet Commonly known as:  ULTRAM Take 1-2 tablets (50-100 mg total) by mouth every 6 (six) hours as needed (mild pain).      Follow-up Information    Gearlean Alf, MD. Schedule an appointment as soon as possible for a visit on 08/14/2016.   Specialty:  Orthopedic Surgery Contact information: [address] [place] [address] [phone]           Signed: [name], PA-C Orthopaedic Surgery 08/02/2016, 7:53 AM

## 2016-08-02 NOTE — Evaluation (Signed)
Physical Therapy Evaluation Patient Details Name: Sandra Stevens MRN: [id number] DOB: 05/26/1951 Today's Date: 08/02/2016   History of Present Illness  Pt s/p R THR with hx of Bil TKR  Clinical Impression  Pt s/p R THR presents with decreased R LE strength/ROM and post op pain limiting functional mobility.  Pt should progress to dc home with family assist and HHPT follow up.    Follow Up Recommendations Home health PT    Equipment Recommendations  Crutches    Recommendations for Other Services OT consult     Precautions / Restrictions Precautions Precautions: Fall Restrictions Weight Bearing Restrictions: No Other Position/Activity Restrictions: WBAT      Mobility  Bed Mobility               General bed mobility comments: Deferred at pt request until after bfast  Transfers                    Ambulation/Gait                Stairs            Wheelchair Mobility    Modified Rankin (Stroke Patients Only)       Balance                                             Pertinent Vitals/Pain Pain Assessment: 0-10 Pain Score: 4  Pain Location: R hip Pain Descriptors / Indicators: Aching;Sore Pain Intervention(s): Limited activity within patient's tolerance;Monitored during session;Premedicated before session;Ice applied    Home Living Family/patient expects to be discharged to:: Private residence Living Arrangements: Spouse/significant other Available Help at Discharge: Family Type of Home: House Home Access: Stairs to enter Entrance Stairs-Rails: Right Entrance Stairs-Number of Steps: 3 Home Layout: Able to live on main level with bedroom/bathroom Home Equipment: Gilford Rile - 2 wheels Additional Comments: Pt states Dr Sandra Stevens is ordering hospital bed for home    Prior Function Level of Independence: Independent;Independent with assistive device(s)               Hand Dominance        Extremity/Trunk  Assessment   Upper Extremity Assessment: Overall WFL for tasks assessed           Lower Extremity Assessment: RLE deficits/detail RLE Deficits / Details: Strength at hip 2/5 with AAROM at hip to 80 flex and 20 abd    Cervical / Trunk Assessment: Normal  Communication   Communication: No difficulties  Cognition Arousal/Alertness: Awake/alert Behavior During Therapy: WFL for tasks assessed/performed Overall Cognitive Status: Within Functional Limits for tasks assessed                      General Comments      Exercises Total Joint Exercises Ankle Circles/Pumps: AROM;Both;15 reps;Supine Quad Sets: AROM;Both;10 reps;Supine Heel Slides: AAROM;Right;15 reps;Supine Hip ABduction/ADduction: AAROM;Right;10 reps;Supine   Assessment/Plan    PT Assessment Patient needs continued PT services  PT Problem List Decreased strength;Decreased range of motion;Decreased activity tolerance;Decreased mobility;Decreased knowledge of use of DME;Pain;Obesity          PT Treatment Interventions DME instruction;Gait training;Stair training;Functional mobility training;Therapeutic activities;Therapeutic exercise;Patient/family education    PT Goals (Current goals can be found in the Care Plan section)  Acute Rehab PT Goals Patient Stated Goal: Regain IND PT Goal Formulation: With patient Time For Goal  Achievement: 08/04/16 Potential to Achieve Goals: Good    Frequency 7X/week   Barriers to discharge        Co-evaluation               End of Session   Activity Tolerance: Patient tolerated treatment well Patient left: in bed;with call bell/phone within reach           Time: 0810-0827 PT Time Calculation (min) (ACUTE ONLY): 17 min   Charges:   PT Evaluation $PT Eval Low Complexity: 1 Procedure     PT G Codes:        Sandra Stevens 08/06/16, 8:34 AM

## 2016-08-03 DIAGNOSIS — Z96641 Presence of right artificial hip joint: Secondary | ICD-10-CM | POA: Diagnosis not present

## 2016-08-03 DIAGNOSIS — M169 Osteoarthritis of hip, unspecified: Secondary | ICD-10-CM | POA: Diagnosis not present

## 2016-08-04 DIAGNOSIS — M47816 Spondylosis without myelopathy or radiculopathy, lumbar region: Secondary | ICD-10-CM | POA: Diagnosis not present

## 2016-08-04 DIAGNOSIS — J45909 Unspecified asthma, uncomplicated: Secondary | ICD-10-CM | POA: Diagnosis not present

## 2016-08-04 DIAGNOSIS — I10 Essential (primary) hypertension: Secondary | ICD-10-CM | POA: Diagnosis not present

## 2016-08-04 DIAGNOSIS — I2510 Atherosclerotic heart disease of native coronary artery without angina pectoris: Secondary | ICD-10-CM | POA: Diagnosis not present

## 2016-08-04 DIAGNOSIS — M519 Unspecified thoracic, thoracolumbar and lumbosacral intervertebral disc disorder: Secondary | ICD-10-CM | POA: Diagnosis not present

## 2016-08-04 DIAGNOSIS — Z471 Aftercare following joint replacement surgery: Secondary | ICD-10-CM | POA: Diagnosis not present

## 2016-08-20 ENCOUNTER — Encounter: Payer: Self-pay | Admitting: Sports Medicine

## 2016-08-20 ENCOUNTER — Other Ambulatory Visit: Payer: Self-pay | Admitting: *Deleted

## 2016-08-20 ENCOUNTER — Ambulatory Visit (INDEPENDENT_AMBULATORY_CARE_PROVIDER_SITE_OTHER): Payer: Medicare Other | Admitting: Sports Medicine

## 2016-08-20 DIAGNOSIS — M21611 Bunion of right foot: Secondary | ICD-10-CM | POA: Diagnosis not present

## 2016-08-20 DIAGNOSIS — M19079 Primary osteoarthritis, unspecified ankle and foot: Secondary | ICD-10-CM | POA: Insufficient documentation

## 2016-08-20 DIAGNOSIS — M2042 Other hammer toe(s) (acquired), left foot: Secondary | ICD-10-CM | POA: Insufficient documentation

## 2016-08-20 DIAGNOSIS — M21612 Bunion of left foot: Secondary | ICD-10-CM

## 2016-08-20 NOTE — Assessment & Plan Note (Signed)
We added additional arch support to current orthotics  May need to make a new softer type with more support

## 2016-08-20 NOTE — Assessment & Plan Note (Signed)
Added first ray posts to current insoles  Will test these

## 2016-08-20 NOTE — Progress Notes (Signed)
  Sandra Stevens - 66 y.o. female MRN [id number]  Date of birth: [birth year] CC: Left foot pain  SUBJECTIVE:    Sandra Stevens is a 65 yo female who presents with longstanding left foot pain.  This pain has been present for years but became worse after she had a "release" operation on her left foot 3 years ago. She describes an achy and often burning sensation affecting the dorsum of her left midfoot. Does not radiate. The pain is constant but worse with weightbearing and at night. She endorses swelling worse at end of the day and bilateral numbness of the plantar surfaces of her feet.   She had been on a walking program of 2 to 3 miles daily but left foot stopped her more than the hip  Past MED - Does not have DM (A1c 5.5 07/2016) Obesity long standing - Taking tylenol, meloxicam, and gabapentin 400mg  qhs. Gabapentin is helpful at night and she has taken it TID for additional relief but has cut back out of concern for drug interactions with recent hip replacement.  HISTORY:  Right total hip replacement - 07/2016 ([facility]) Left mid foot fracture - 20 years ago  DATA REVIEWED: MRI Left foot - 10/2015 This showed some mild DJD of ankle and subtalar joints The Midfoot shows moderate arthritis at TMT jioints These findings consistent with Exam today  PHYSICAL EXAM:  VS: BP:(!) 141/75  HR:66bpm  HT:5\' 6"  (167.6 cm)   WT:232 lb (105.2 kg)  BMI:37.5  Pleasant, obese female. No acute distress. L foot: No swelling or erythema. Well healed surgical scars along medial aspect of heel and posteromedial aspect mid-calf. Valgus deviation of great toe.  2nd PIP hammer toe displaced by great toe.  Tarsometatarsal bossing.  Mild diffuse tenderness on dorsum of midfoot.  Neuro: Walking with cane. Left trendelenburg. Knee flexion and extension 5/5 bilaterally. Plantar and dorsiflexion 5/5 bilaterally.  ASSESSMENT & PLAN:  Midfoot arthritis of left foot: - Added arch support and padding of great toe to  existing orthotics - Recommended more rigid hammer toe pad  - Use toe spacers she has at home  Follow up in 1 month

## 2016-08-31 DIAGNOSIS — G4730 Sleep apnea, unspecified: Secondary | ICD-10-CM | POA: Diagnosis not present

## 2016-09-07 DIAGNOSIS — G4733 Obstructive sleep apnea (adult) (pediatric): Secondary | ICD-10-CM | POA: Diagnosis not present

## 2016-09-11 DIAGNOSIS — Z471 Aftercare following joint replacement surgery: Secondary | ICD-10-CM | POA: Diagnosis not present

## 2016-09-11 DIAGNOSIS — Z96641 Presence of right artificial hip joint: Secondary | ICD-10-CM | POA: Diagnosis not present

## 2016-09-12 DIAGNOSIS — M47816 Spondylosis without myelopathy or radiculopathy, lumbar region: Secondary | ICD-10-CM | POA: Diagnosis not present

## 2016-09-20 ENCOUNTER — Encounter: Payer: Self-pay | Admitting: Sports Medicine

## 2016-09-20 ENCOUNTER — Ambulatory Visit (INDEPENDENT_AMBULATORY_CARE_PROVIDER_SITE_OTHER): Payer: Medicare Other | Admitting: Sports Medicine

## 2016-09-20 VITALS — BP 140/88 | Ht 66.0 in | Wt 226.0 lb

## 2016-09-20 DIAGNOSIS — M19079 Primary osteoarthritis, unspecified ankle and foot: Secondary | ICD-10-CM | POA: Diagnosis not present

## 2016-09-20 NOTE — Patient Instructions (Signed)
Please use aspercreme with 4% lidocaine to rub on foot twice daily as needed

## 2016-09-20 NOTE — Progress Notes (Signed)
  Sandra Stevens - 65 y.o. female MRN [id number]  Date of birth: [birth year]  SUBJECTIVE:  Including CC & ROS.  CC: left foot pain Presents with left foot pain as a follow-up from a month ago. At that visit she had modifications to orthotics that were customized from another doctor. She reports no difference in her pain. She reports the pain is at her midfoot and is sometimes a burning pain. She states that gabapentin does help but she only takes that at night. It does help her go to sleep. She reports that her left foot remains swollen compared to her right. She has a history of a release of a muscle or tendon on her left foot but she does not know which one. She would like to get back into walking.    ROS: No unexpected weight loss, fever, chills, swelling, instability, muscle pain, numbness/tingling, redness, otherwise see HPI   PMHx - Updated and reviewed.  Contributory factors include: Negative PSHx - Updated and reviewed.  Contributory factors include:  Negative FHx - Updated and reviewed.  Contributory factors include:  Negative Social Hx - Updated and reviewed. Contributory factors include: Negative Medications - reviewed   DATA REVIEWED: Previous office visits  PHYSICAL EXAM:  VS: BP:140/88  HR: bpm  TEMP: ( )  RESP:   HT:5\' 6"  (167.6 cm)   WT:226 lb (102.5 kg)  BMI:36.6 PHYSICAL EXAM: Gen: NAD, alert, cooperative with exam, well-appearing HEENT: clear conjunctiva,  CV:  no edema, capillary refill brisk, normal rate Resp: non-labored Skin: no rashes, normal turgor  Neuro: no gross deficits.  Psych:  alert and oriented  Ankle & Foot: + visible swelling and midfoot area of her left foot Arch: Normal w/o pes cavus or planus  She has pain over her midfoot on her left foot. Hammertoe present on her bilateral second toes No pain at MT heads No sign of peroneal tendon subluxations or tenderness to palpation Full in plantarflexion, dorsiflexion, inversion, and eversion of the  foot; flexion and extension of the toes Strength: 5/5 in all directions. Sensation: intact Vascular: intact w/ dorsalis pedis & posterior tibialis pulses 2+  Ultrasound: Ultrasound limited of her left foot reveals mid foot arthritis with effusion present in between tarsal joints. She also has spurring in her midfoot. Findings consistent with midfoot arthritis with effusion.    ASSESSMENT & PLAN:   Arthritis of midfoot Seen on ultrasound today. She does have an effusion in her midfoot. We'll place her in Green insoles with bilateral scaphoid pads to help with arch support and cushioning of her foot. She'll follow-up in a month for custom orthotics.  Continue gabapentin as may be having contribution of peroneal nerve. Also recommended Aspercreme with lidocaine.

## 2016-09-20 NOTE — Assessment & Plan Note (Addendum)
Seen on ultrasound today. She does have an effusion in her midfoot. We'll place her in Green insoles with bilateral scaphoid pads to help with arch support and cushioning of her foot. She'll follow-up in a month for custom orthotics.  Continue gabapentin as may be having contribution of peroneal nerve. Also recommended Aspercreme with lidocaine.

## 2016-10-02 DIAGNOSIS — M25512 Pain in left shoulder: Secondary | ICD-10-CM | POA: Diagnosis not present

## 2016-10-07 DIAGNOSIS — G4733 Obstructive sleep apnea (adult) (pediatric): Secondary | ICD-10-CM | POA: Diagnosis not present

## 2016-10-30 ENCOUNTER — Ambulatory Visit (INDEPENDENT_AMBULATORY_CARE_PROVIDER_SITE_OTHER): Payer: Medicare Other | Admitting: Sports Medicine

## 2016-10-30 ENCOUNTER — Encounter: Payer: Self-pay | Admitting: Sports Medicine

## 2016-10-30 DIAGNOSIS — M21611 Bunion of right foot: Secondary | ICD-10-CM | POA: Diagnosis not present

## 2016-10-30 DIAGNOSIS — M19079 Primary osteoarthritis, unspecified ankle and foot: Secondary | ICD-10-CM

## 2016-10-30 DIAGNOSIS — M21612 Bunion of left foot: Secondary | ICD-10-CM | POA: Diagnosis not present

## 2016-10-30 NOTE — Progress Notes (Signed)
CC;  Left midfoot pain  On last visit patient given Aspecream with lidocaine This helps with pain Also given sports insoles with scaphoid padding This feels better than older rigid orthotics Pain decreased by at least 50% Has not tried much walking yet  Past Hx THR on RT October 2017  ROS No numbness in left foot Pain increases with walking  PEXAM NAD BP 108/71   Pulse 75   Ht 5\' 6"  (1.676 m)   Wt 232 lb (105.2 kg)   BMI 37.45 kg/m  Moderate arch bilat but some loss of longitudinal arch height Hammer toe on 2 bilat Less TTP over midfoot Mild midfoot bossing Bunions Cross over toes  Walking gait with mild pronation  Patient was fitted for a : standard, cushioned, semi-rigid orthotic. The orthotic was heated and afterward the patient stood on the orthotic blank positioned on the orthotic stand. The patient was positioned in subtalar neutral position and 10 degrees of ankle dorsiflexion in a weight bearing stance. After completion of molding, a stable base was applied to the orthotic blank. The blank was ground to a stable position for weight bearing. Size: 8 red EVA Base: blue med EVA Posting: none Additional orthotic padding: none

## 2016-10-30 NOTE — Assessment & Plan Note (Signed)
Hopefully orthotics will help lessen any pain  Reck on RTC

## 2016-10-30 NOTE — Assessment & Plan Note (Signed)
She improved with sport insole and scaphoid padding  We will convert her to custom orthotic  Once these were completed the custom insert controlled her pronation and ner gait was neutral  She felt better cushion Use 3 mos and recheck

## 2016-11-07 DIAGNOSIS — G4733 Obstructive sleep apnea (adult) (pediatric): Secondary | ICD-10-CM | POA: Diagnosis not present

## 2016-12-08 DIAGNOSIS — G4733 Obstructive sleep apnea (adult) (pediatric): Secondary | ICD-10-CM | POA: Diagnosis not present

## 2017-01-05 DIAGNOSIS — G4733 Obstructive sleep apnea (adult) (pediatric): Secondary | ICD-10-CM | POA: Diagnosis not present

## 2017-01-10 DIAGNOSIS — D1800 Hemangioma unspecified site: Secondary | ICD-10-CM | POA: Diagnosis not present

## 2017-01-10 DIAGNOSIS — L814 Other melanin hyperpigmentation: Secondary | ICD-10-CM | POA: Diagnosis not present

## 2017-01-10 DIAGNOSIS — L821 Other seborrheic keratosis: Secondary | ICD-10-CM | POA: Diagnosis not present

## 2017-01-10 DIAGNOSIS — Z86018 Personal history of other benign neoplasm: Secondary | ICD-10-CM | POA: Diagnosis not present

## 2017-02-05 DIAGNOSIS — G4733 Obstructive sleep apnea (adult) (pediatric): Secondary | ICD-10-CM | POA: Diagnosis not present

## 2017-02-06 ENCOUNTER — Other Ambulatory Visit: Payer: Self-pay | Admitting: Internal Medicine

## 2017-02-06 DIAGNOSIS — Z1231 Encounter for screening mammogram for malignant neoplasm of breast: Secondary | ICD-10-CM

## 2017-03-07 DIAGNOSIS — G4733 Obstructive sleep apnea (adult) (pediatric): Secondary | ICD-10-CM | POA: Diagnosis not present

## 2017-03-21 ENCOUNTER — Ambulatory Visit
Admission: RE | Admit: 2017-03-21 | Discharge: 2017-03-21 | Disposition: A | Discharge status: Home / Self Care | Payer: Medicare Other | Source: Ambulatory Visit | Attending: Internal Medicine | Admitting: Internal Medicine

## 2017-03-21 DIAGNOSIS — Z1231 Encounter for screening mammogram for malignant neoplasm of breast: Secondary | ICD-10-CM | POA: Diagnosis not present

## 2017-04-02 ENCOUNTER — Other Ambulatory Visit: Payer: Self-pay | Admitting: Podiatry

## 2017-04-07 DIAGNOSIS — G4733 Obstructive sleep apnea (adult) (pediatric): Secondary | ICD-10-CM | POA: Diagnosis not present

## 2017-04-22 IMAGING — DX DG PORTABLE PELVIS
1 series · 1 of 1 positions shown · non-contrast
Comparison: None.

CLINICAL DATA: Status post total hip replacement on the right

EXAM:
PORTABLE PELVIS 1-2 VIEWS

[pelvis ap]
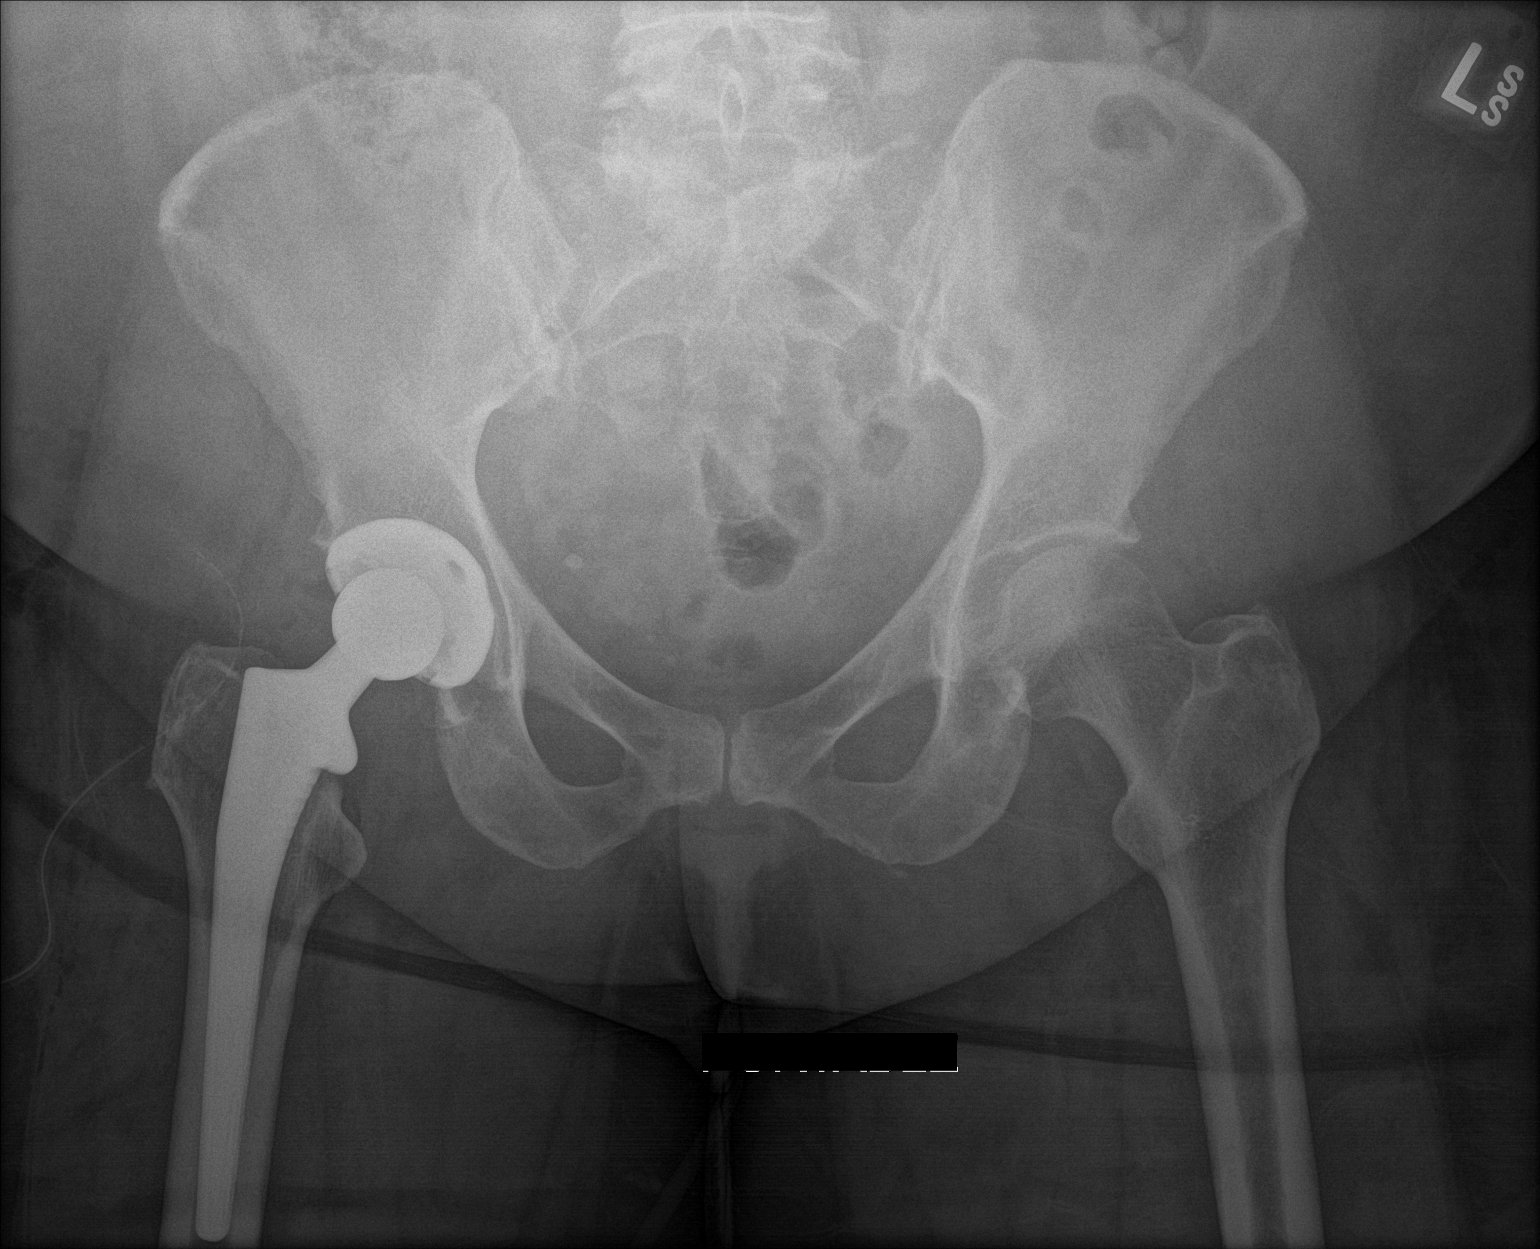

[1 of 1 positions shown; findings below may reference images not displayed]

FINDINGS: Frontal pelvis obtained. There is a total hip prosthesis on the
right with prosthetic components appearing well-seated. No acute
fracture or dislocation evident. Surgical drain noted laterally on
the right. There is slight narrowing of the left hip joint.
IMPRESSION: Total hip prosthetic components on the right appear well seated. No
acute fracture or dislocation. Slight narrowing left hip joint.

## 2017-05-07 DIAGNOSIS — G4733 Obstructive sleep apnea (adult) (pediatric): Secondary | ICD-10-CM | POA: Diagnosis not present

## 2017-06-07 DIAGNOSIS — G4733 Obstructive sleep apnea (adult) (pediatric): Secondary | ICD-10-CM | POA: Diagnosis not present

## 2017-07-03 DIAGNOSIS — Z6841 Body Mass Index (BMI) 40.0 and over, adult: Secondary | ICD-10-CM | POA: Diagnosis not present

## 2017-07-03 DIAGNOSIS — Z01419 Encounter for gynecological examination (general) (routine) without abnormal findings: Secondary | ICD-10-CM | POA: Diagnosis not present

## 2017-07-08 DIAGNOSIS — G4733 Obstructive sleep apnea (adult) (pediatric): Secondary | ICD-10-CM | POA: Diagnosis not present

## 2017-07-25 DIAGNOSIS — Z791 Long term (current) use of non-steroidal anti-inflammatories (NSAID): Secondary | ICD-10-CM | POA: Diagnosis not present

## 2017-07-25 DIAGNOSIS — I10 Essential (primary) hypertension: Secondary | ICD-10-CM | POA: Diagnosis not present

## 2017-07-25 DIAGNOSIS — Z0000 Encounter for general adult medical examination without abnormal findings: Secondary | ICD-10-CM | POA: Diagnosis not present

## 2017-07-31 DIAGNOSIS — Z8269 Family history of other diseases of the musculoskeletal system and connective tissue: Secondary | ICD-10-CM | POA: Diagnosis not present

## 2017-07-31 DIAGNOSIS — Z800 Family history of malignant neoplasm of digestive organs: Secondary | ICD-10-CM | POA: Diagnosis not present

## 2017-07-31 DIAGNOSIS — I10 Essential (primary) hypertension: Secondary | ICD-10-CM | POA: Diagnosis not present

## 2017-07-31 DIAGNOSIS — Z0001 Encounter for general adult medical examination with abnormal findings: Secondary | ICD-10-CM | POA: Diagnosis not present

## 2017-07-31 DIAGNOSIS — R0683 Snoring: Secondary | ICD-10-CM | POA: Diagnosis not present

## 2017-08-07 DIAGNOSIS — G4733 Obstructive sleep apnea (adult) (pediatric): Secondary | ICD-10-CM | POA: Diagnosis not present

## 2017-09-07 DIAGNOSIS — G4733 Obstructive sleep apnea (adult) (pediatric): Secondary | ICD-10-CM | POA: Diagnosis not present

## 2017-09-16 ENCOUNTER — Ambulatory Visit (INDEPENDENT_AMBULATORY_CARE_PROVIDER_SITE_OTHER): Payer: Medicare Other | Admitting: Pulmonary Disease

## 2017-09-16 ENCOUNTER — Encounter: Payer: Self-pay | Admitting: Pulmonary Disease

## 2017-09-16 VITALS — BP 114/74 | HR 71 | Ht 65.0 in | Wt 251.0 lb

## 2017-09-16 DIAGNOSIS — G4734 Idiopathic sleep related nonobstructive alveolar hypoventilation: Secondary | ICD-10-CM | POA: Diagnosis not present

## 2017-09-16 DIAGNOSIS — Z6841 Body Mass Index (BMI) 40.0 and over, adult: Secondary | ICD-10-CM | POA: Diagnosis not present

## 2017-09-16 NOTE — Patient Instructions (Signed)
Will arrange for overnight oxygen test with you using 2 liters oxygen and call with results

## 2017-09-16 NOTE — Progress Notes (Signed)
Past Surgical History She  has a past surgical history that includes Total knee arthroplasty ('04 / '05); Ankle surgery (Nov 2012); Knee surgery; Wrist surgery; Tonsillectomy; Wisdom tooth extraction; Eye surgery; Dilation and curettage of uterus; Tarsal tunnel release; endometrial septum (06/11/12); Colonoscopy w/ polypectomy; Colonoscopy (2010); facet injections (11/03/13); Polypectomy; Parotid gland tumor excision (Right); and Total hip arthroplasty (Right, 08/01/2016).  No Known Allergies  Family History Her family history includes Colon cancer in her cousin, father, and maternal grandmother; Hypertension in her unknown relative.  Social History She  reports that she quit smoking about 18 years ago. Her smoking use included cigarettes. She has a 10.00 pack-year smoking history. she has never used smokeless tobacco. She reports that she drinks alcohol. She reports that she does not use drugs.  Review of systems Constitutional: Negative for fever and unexpected weight change.  HENT: Negative for congestion, dental problem, ear pain, nosebleeds, postnasal drip, rhinorrhea, sinus pressure, sneezing, sore throat and trouble swallowing.   Eyes: Negative for redness and itching.  Respiratory: Positive for shortness of breath. Negative for cough, chest tightness and wheezing.   Cardiovascular: Negative for palpitations and leg swelling.  Gastrointestinal: Negative for nausea and vomiting.  Genitourinary: Negative for dysuria.  Musculoskeletal: Negative for joint swelling.  Skin: Negative for rash.  Allergic/Immunologic: Negative.  Negative for environmental allergies, food allergies and immunocompromised state.  Neurological: Negative for headaches.  Hematological: Does not bruise/bleed easily.  Psychiatric/Behavioral: Negative for dysphoric mood. The patient is not nervous/anxious.     Current Outpatient Medications on File Prior to Visit  Medication Sig  . amitriptyline (ELAVIL) 10 MG tablet  TAKE 2 TABLETS BY MOUTH AT BEDTIME. (Patient taking differently: Take 20 mg by mouth at bedtime)  . gabapentin (NEURONTIN) 400 MG capsule Take 1 capsule (400 mg total) by mouth 3 (three) times daily. (Patient taking differently: Take 400 mg by mouth at bedtime. )  . irbesartan-hydrochlorothiazide (AVALIDE) 300-12.5 MG tablet Take 1 tablet by mouth daily.  . pantoprazole (PROTONIX) 40 MG tablet Take 40 mg by mouth daily.  . zaleplon (SONATA) 10 MG capsule    No current facility-administered medications on file prior to visit.     Chief Complaint  Patient presents with  . Sleep Consult    Pt referred by Dr. Deland Pretty MD for sleep apnea and snoring. Pt has SOB with exertion, uses O2 at night at 2liters with DME-[facility]. Pt has some daytime sleepiness.    Past medical history She  has a past medical history of Arthritis, Coronary artery calcification, Cystitis, GERD (gastroesophageal reflux disease), Hemorrhoid, HTN (hypertension), OSA on CPAP, PONV (postoperative nausea and vomiting), Sleep apnea, SVD (spontaneous vaginal delivery), and Varicose vein of leg.  Vital signs BP 114/74 (BP Location: Left Arm, Cuff Size: Normal)   Pulse 71   Ht 5\' 5"  (1.651 m)   Wt 251 lb (113.9 kg)   SpO2 98%   BMI 41.77 kg/m   History of Present Illness Sandra Stevens is a 66 y.o. female for evaluation of sleep problems.  She had a sleep study several years ago.  She was found to have sleep apnea.  She was on CPAP.  She used it, but wasn't sure it helped.  She didn't like using it.  She then had another sleep study in 2016 with Dr. Brett Fairy.  She was told her sleep apnea was insignificant and she didn't need CPAP anymore.  She then had hip surgery about a year ago.  Before this she was  found to have low oxygen at night.  She was then started on 2 liters oxygen at night.    She goes to sleep at midnight.  She falls asleep after 5 minutes.  She wakes up 1 or 2 times to use the bathroom.  She gets out of  bed at 10 am.  She feels okay in the morning.  She denies morning headache.  She does not use anything to help her stay awake.  She has been using neurontin to help with leg symptoms, and this helps her sleep.  She was also started on elavil for nocturia and this helps her sleep.  She denies sleep walking, sleep talking, bruxism, or nightmares.  There is no history of restless legs.  She denies sleep hallucinations, sleep paralysis, or cataplexy.  The Epworth score is 2 out of 24.   Physical Exam:  General - No distress Eyes - pupils reactive ENT - No sinus tenderness, no oral exudate, no LAN, no thyromegaly, TM clear, pupils equal/reactive Cardiac - s1s2 regular, no murmur, pulses symmetric Chest - No wheeze/rales/dullness, good air entry, normal respiratory excursion Back - No focal tenderness Abd - Soft, non-tender, no organomegaly, + bowel sounds Ext - No edema Neuro - Normal strength, cranial nerves intact Skin - No rashes Psych - Normal mood, and behavior  Discussion: She has prior history of very mild sleep apnea and was advised she didn't need CPAP.  She did not like using CPAP before.  She had overnight oximetry about 1 year ago and found to have persistent nocturnal hypoxia.  She is sleeping better since using oxygen.  Assessment/plan:  Sleep related hypoxia. - will arrange for overnight oximetry with 2 liters oxygen - if she has persistent hypoxia, then she will need repeat sleep study >> can be home sleep study - if she is found to have sleep apnea, then oral appliance might be an option in place of CPAP  Obesity. - discussed how her weight could be contributing to her low oxygen at night   Patient Instructions  Will arrange for overnight oxygen test with you using 2 liters oxygen and call with results    Chesley Mires, M.D. Pager [phone] 09/16/2017, 10:33 AM

## 2017-09-16 NOTE — Progress Notes (Signed)
   Subjective:    Patient ID: ANAHLI ARVANITIS, female    DOB: [birth year], 66 y.o.   MRN: [id number]  HPI    Review of Systems  Constitutional: Negative for fever and unexpected weight change.  HENT: Negative for congestion, dental problem, ear pain, nosebleeds, postnasal drip, rhinorrhea, sinus pressure, sneezing, sore throat and trouble swallowing.   Eyes: Negative for redness and itching.  Respiratory: Positive for shortness of breath. Negative for cough, chest tightness and wheezing.   Cardiovascular: Negative for palpitations and leg swelling.  Gastrointestinal: Negative for nausea and vomiting.  Genitourinary: Negative for dysuria.  Musculoskeletal: Negative for joint swelling.  Skin: Negative for rash.  Allergic/Immunologic: Negative.  Negative for environmental allergies, food allergies and immunocompromised state.  Neurological: Negative for headaches.  Hematological: Does not bruise/bleed easily.  Psychiatric/Behavioral: Negative for dysphoric mood. The patient is not nervous/anxious.        Objective:   Physical Exam        Assessment & Plan:

## 2017-10-07 ENCOUNTER — Telehealth: Payer: Self-pay | Admitting: Pulmonary Disease

## 2017-10-07 DIAGNOSIS — G4733 Obstructive sleep apnea (adult) (pediatric): Secondary | ICD-10-CM | POA: Diagnosis not present

## 2017-10-07 NOTE — Telephone Encounter (Signed)
Called [facility] & spoke to [place].  Order was received and she will have [place] to call pt tomorrow.  She has already left for today.  I called pt & she states someone from [place] has already called her & she has been scheduled for tomorrow.  Nothing further needed.

## 2017-10-07 NOTE — Telephone Encounter (Signed)
ONO was ordered on 09/16/17.  PCCs please advise.  Thanks.

## 2017-10-16 NOTE — Telephone Encounter (Signed)
Rec'd fax on 10-10-2017 from [employer] stating the Overnight Oximetry that was ordered is a duplicate order. [place] ID [id number]. That if any questions call the office at [phone]  Called and spoke with [facility] with [facility] this morning. She states that the ONO for this patient has not been completed, that she is placing the request for ONO back into the que for [place].   Called spoke with [place] with [facility] advised her the ONO request is back in their que to call the patient. She stated that she will have Christy call the patient today to reschedule ONO. Routing message to [facility] in [facility].

## 2017-11-07 DIAGNOSIS — G4733 Obstructive sleep apnea (adult) (pediatric): Secondary | ICD-10-CM | POA: Diagnosis not present

## 2017-12-08 DIAGNOSIS — G4733 Obstructive sleep apnea (adult) (pediatric): Secondary | ICD-10-CM | POA: Diagnosis not present

## 2018-01-05 DIAGNOSIS — G4733 Obstructive sleep apnea (adult) (pediatric): Secondary | ICD-10-CM | POA: Diagnosis not present

## 2018-01-14 DIAGNOSIS — L814 Other melanin hyperpigmentation: Secondary | ICD-10-CM | POA: Diagnosis not present

## 2018-01-14 DIAGNOSIS — L821 Other seborrheic keratosis: Secondary | ICD-10-CM | POA: Diagnosis not present

## 2018-01-14 DIAGNOSIS — D1800 Hemangioma unspecified site: Secondary | ICD-10-CM | POA: Diagnosis not present

## 2018-01-14 DIAGNOSIS — L570 Actinic keratosis: Secondary | ICD-10-CM | POA: Diagnosis not present

## 2018-02-05 DIAGNOSIS — G4733 Obstructive sleep apnea (adult) (pediatric): Secondary | ICD-10-CM | POA: Diagnosis not present

## 2018-03-07 DIAGNOSIS — G4733 Obstructive sleep apnea (adult) (pediatric): Secondary | ICD-10-CM | POA: Diagnosis not present

## 2018-04-07 DIAGNOSIS — G4733 Obstructive sleep apnea (adult) (pediatric): Secondary | ICD-10-CM | POA: Diagnosis not present

## 2018-05-07 DIAGNOSIS — G4733 Obstructive sleep apnea (adult) (pediatric): Secondary | ICD-10-CM | POA: Diagnosis not present

## 2018-05-15 ENCOUNTER — Ambulatory Visit
Admission: RE | Admit: 2018-05-15 | Discharge: 2018-05-15 | Disposition: A | Discharge status: Home / Self Care | Payer: Medicare Other | Source: Ambulatory Visit | Attending: Internal Medicine | Admitting: Internal Medicine

## 2018-05-15 ENCOUNTER — Other Ambulatory Visit: Payer: Self-pay | Admitting: Internal Medicine

## 2018-05-15 DIAGNOSIS — Z1231 Encounter for screening mammogram for malignant neoplasm of breast: Secondary | ICD-10-CM | POA: Diagnosis not present

## 2018-06-07 DIAGNOSIS — G4733 Obstructive sleep apnea (adult) (pediatric): Secondary | ICD-10-CM | POA: Diagnosis not present

## 2018-07-07 DIAGNOSIS — Z01419 Encounter for gynecological examination (general) (routine) without abnormal findings: Secondary | ICD-10-CM | POA: Diagnosis not present

## 2018-07-07 DIAGNOSIS — Z6841 Body Mass Index (BMI) 40.0 and over, adult: Secondary | ICD-10-CM | POA: Diagnosis not present

## 2018-07-08 DIAGNOSIS — G4733 Obstructive sleep apnea (adult) (pediatric): Secondary | ICD-10-CM | POA: Diagnosis not present

## 2018-07-31 DIAGNOSIS — I10 Essential (primary) hypertension: Secondary | ICD-10-CM | POA: Diagnosis not present

## 2018-07-31 DIAGNOSIS — Z791 Long term (current) use of non-steroidal anti-inflammatories (NSAID): Secondary | ICD-10-CM | POA: Diagnosis not present

## 2018-08-06 DIAGNOSIS — R7989 Other specified abnormal findings of blood chemistry: Secondary | ICD-10-CM | POA: Diagnosis not present

## 2018-08-06 DIAGNOSIS — R0683 Snoring: Secondary | ICD-10-CM | POA: Diagnosis not present

## 2018-08-06 DIAGNOSIS — I10 Essential (primary) hypertension: Secondary | ICD-10-CM | POA: Diagnosis not present

## 2018-08-06 DIAGNOSIS — M79672 Pain in left foot: Secondary | ICD-10-CM | POA: Diagnosis not present

## 2018-08-06 DIAGNOSIS — Z0001 Encounter for general adult medical examination with abnormal findings: Secondary | ICD-10-CM | POA: Diagnosis not present

## 2018-08-07 ENCOUNTER — Telehealth: Payer: Self-pay | Admitting: Pulmonary Disease

## 2018-08-07 DIAGNOSIS — G4734 Idiopathic sleep related nonobstructive alveolar hypoventilation: Secondary | ICD-10-CM

## 2018-08-07 DIAGNOSIS — G4733 Obstructive sleep apnea (adult) (pediatric): Secondary | ICD-10-CM | POA: Diagnosis not present

## 2018-08-07 NOTE — Telephone Encounter (Signed)
Pt was seen in office 09/16/17.  She was to have overnight oximetry with 2 liters oxygen.  Please refer to phone note from 10/07/17.  It does not appear that the ONO was scheduled by DME.  Please reschedule ONO with 2 liters and inform pt that we will call her to schedule follow up after this is reviewed.

## 2018-08-07 NOTE — Telephone Encounter (Signed)
Attempted to call patient today regarding results. I did not receive an answer at time of call. I have left a voicemail message for pt to return call. X1  Placed order for ONO today

## 2018-08-08 NOTE — Telephone Encounter (Signed)
Called and spoke with patient regarding results.  Informed the patient of VS recommendations today. Advised that ONO is placed, and she will need to do this with 2L Pt verbalized understanding and denied any questions or concerns at this time.  Nothing further needed.

## 2018-08-26 ENCOUNTER — Telehealth: Payer: Self-pay | Admitting: Pulmonary Disease

## 2018-08-26 NOTE — Telephone Encounter (Signed)
ONO with 2 liters 08/19/18 >> test time 7 hrs 23 min.  Basal SpO2 96.6%, low SpO2 89%.   Please let her know oxygen test looked good.  She should continue using 2 liters oxygen at night.

## 2018-08-28 NOTE — Telephone Encounter (Signed)
Attempted to call patient today regarding results. I did not receive an answer at time of call. I have left a voicemail message for pt to return call. X1  

## 2018-09-01 NOTE — Telephone Encounter (Signed)
Called and spoke with patient regarding results.  Informed the patient of results and recommendations today. Pt verbalized understanding and denied any questions or concerns at this time.  Nothing further needed.  

## 2018-09-07 DIAGNOSIS — G4733 Obstructive sleep apnea (adult) (pediatric): Secondary | ICD-10-CM | POA: Diagnosis not present

## 2018-09-08 DIAGNOSIS — M2042 Other hammer toe(s) (acquired), left foot: Secondary | ICD-10-CM | POA: Diagnosis not present

## 2018-09-08 DIAGNOSIS — I10 Essential (primary) hypertension: Secondary | ICD-10-CM | POA: Diagnosis not present

## 2018-09-08 DIAGNOSIS — M79672 Pain in left foot: Secondary | ICD-10-CM | POA: Diagnosis not present

## 2018-09-08 DIAGNOSIS — M7742 Metatarsalgia, left foot: Secondary | ICD-10-CM | POA: Diagnosis not present

## 2018-09-08 DIAGNOSIS — M21612 Bunion of left foot: Secondary | ICD-10-CM | POA: Diagnosis not present

## 2018-10-07 DIAGNOSIS — G4733 Obstructive sleep apnea (adult) (pediatric): Secondary | ICD-10-CM | POA: Diagnosis not present

## 2018-11-07 DIAGNOSIS — G4733 Obstructive sleep apnea (adult) (pediatric): Secondary | ICD-10-CM | POA: Diagnosis not present

## 2018-11-24 DIAGNOSIS — M19072 Primary osteoarthritis, left ankle and foot: Secondary | ICD-10-CM | POA: Diagnosis not present

## 2018-11-24 DIAGNOSIS — M21612 Bunion of left foot: Secondary | ICD-10-CM | POA: Diagnosis not present

## 2018-11-24 DIAGNOSIS — M7742 Metatarsalgia, left foot: Secondary | ICD-10-CM | POA: Diagnosis not present

## 2018-11-24 DIAGNOSIS — M2042 Other hammer toe(s) (acquired), left foot: Secondary | ICD-10-CM | POA: Diagnosis not present

## 2018-12-03 DIAGNOSIS — M722 Plantar fascial fibromatosis: Secondary | ICD-10-CM | POA: Diagnosis not present

## 2018-12-05 DIAGNOSIS — M722 Plantar fascial fibromatosis: Secondary | ICD-10-CM | POA: Diagnosis not present

## 2018-12-08 DIAGNOSIS — M722 Plantar fascial fibromatosis: Secondary | ICD-10-CM | POA: Diagnosis not present

## 2018-12-08 DIAGNOSIS — G4733 Obstructive sleep apnea (adult) (pediatric): Secondary | ICD-10-CM | POA: Diagnosis not present

## 2018-12-10 DIAGNOSIS — M722 Plantar fascial fibromatosis: Secondary | ICD-10-CM | POA: Diagnosis not present

## 2018-12-17 DIAGNOSIS — M722 Plantar fascial fibromatosis: Secondary | ICD-10-CM | POA: Diagnosis not present

## 2019-01-05 DIAGNOSIS — M19072 Primary osteoarthritis, left ankle and foot: Secondary | ICD-10-CM | POA: Diagnosis not present

## 2019-01-05 DIAGNOSIS — M79672 Pain in left foot: Secondary | ICD-10-CM | POA: Diagnosis not present

## 2019-01-06 DIAGNOSIS — G4733 Obstructive sleep apnea (adult) (pediatric): Secondary | ICD-10-CM | POA: Diagnosis not present

## 2019-02-06 DIAGNOSIS — G4733 Obstructive sleep apnea (adult) (pediatric): Secondary | ICD-10-CM | POA: Diagnosis not present

## 2019-03-03 DIAGNOSIS — Z20828 Contact with and (suspected) exposure to other viral communicable diseases: Secondary | ICD-10-CM | POA: Diagnosis not present

## 2019-03-08 DIAGNOSIS — G4733 Obstructive sleep apnea (adult) (pediatric): Secondary | ICD-10-CM | POA: Diagnosis not present

## 2019-04-02 ENCOUNTER — Other Ambulatory Visit: Payer: Self-pay

## 2019-04-02 DIAGNOSIS — Z1231 Encounter for screening mammogram for malignant neoplasm of breast: Secondary | ICD-10-CM

## 2019-04-08 DIAGNOSIS — G4733 Obstructive sleep apnea (adult) (pediatric): Secondary | ICD-10-CM | POA: Diagnosis not present

## 2019-05-08 DIAGNOSIS — G4733 Obstructive sleep apnea (adult) (pediatric): Secondary | ICD-10-CM | POA: Diagnosis not present

## 2019-05-28 DIAGNOSIS — H0015 Chalazion left lower eyelid: Secondary | ICD-10-CM | POA: Diagnosis not present

## 2019-06-01 ENCOUNTER — Ambulatory Visit
Admission: RE | Admit: 2019-06-01 | Discharge: 2019-06-01 | Disposition: A | Discharge status: Home / Self Care | Payer: Medicare Other | Source: Ambulatory Visit | Attending: Internal Medicine | Admitting: Internal Medicine

## 2019-06-01 ENCOUNTER — Other Ambulatory Visit: Payer: Self-pay

## 2019-06-01 DIAGNOSIS — Z1231 Encounter for screening mammogram for malignant neoplasm of breast: Secondary | ICD-10-CM

## 2019-06-02 DIAGNOSIS — Z0120 Encounter for dental examination and cleaning without abnormal findings: Secondary | ICD-10-CM | POA: Diagnosis not present

## 2019-06-08 DIAGNOSIS — G4733 Obstructive sleep apnea (adult) (pediatric): Secondary | ICD-10-CM | POA: Diagnosis not present

## 2019-06-11 DIAGNOSIS — Z9889 Other specified postprocedural states: Secondary | ICD-10-CM | POA: Diagnosis not present

## 2019-06-11 DIAGNOSIS — H0015 Chalazion left lower eyelid: Secondary | ICD-10-CM | POA: Diagnosis not present

## 2019-07-09 DIAGNOSIS — G4733 Obstructive sleep apnea (adult) (pediatric): Secondary | ICD-10-CM | POA: Diagnosis not present

## 2019-08-05 DIAGNOSIS — I10 Essential (primary) hypertension: Secondary | ICD-10-CM | POA: Diagnosis not present

## 2019-08-08 DIAGNOSIS — G4733 Obstructive sleep apnea (adult) (pediatric): Secondary | ICD-10-CM | POA: Diagnosis not present

## 2019-08-10 DIAGNOSIS — R351 Nocturia: Secondary | ICD-10-CM | POA: Diagnosis not present

## 2019-08-10 DIAGNOSIS — R292 Abnormal reflex: Secondary | ICD-10-CM | POA: Diagnosis not present

## 2019-08-10 DIAGNOSIS — Z0000 Encounter for general adult medical examination without abnormal findings: Secondary | ICD-10-CM | POA: Diagnosis not present

## 2019-08-10 DIAGNOSIS — I10 Essential (primary) hypertension: Secondary | ICD-10-CM | POA: Diagnosis not present

## 2019-09-04 ENCOUNTER — Encounter: Payer: Self-pay | Admitting: Internal Medicine

## 2019-12-03 ENCOUNTER — Ambulatory Visit: Payer: Medicare Other

## 2019-12-06 ENCOUNTER — Ambulatory Visit: Payer: Medicare Other

## 2019-12-08 DIAGNOSIS — Z0120 Encounter for dental examination and cleaning without abnormal findings: Secondary | ICD-10-CM | POA: Diagnosis not present

## 2020-02-25 ENCOUNTER — Other Ambulatory Visit: Payer: Self-pay

## 2020-02-25 ENCOUNTER — Encounter: Payer: Self-pay | Admitting: Physician Assistant

## 2020-02-25 ENCOUNTER — Ambulatory Visit: Payer: Medicare Other | Admitting: Physician Assistant

## 2020-02-25 DIAGNOSIS — L821 Other seborrheic keratosis: Secondary | ICD-10-CM | POA: Diagnosis not present

## 2020-02-25 DIAGNOSIS — D485 Neoplasm of uncertain behavior of skin: Secondary | ICD-10-CM | POA: Diagnosis not present

## 2020-02-25 DIAGNOSIS — L814 Other melanin hyperpigmentation: Secondary | ICD-10-CM

## 2020-02-25 DIAGNOSIS — L430 Hypertrophic lichen planus: Secondary | ICD-10-CM | POA: Diagnosis not present

## 2020-02-25 DIAGNOSIS — Z1283 Encounter for screening for malignant neoplasm of skin: Secondary | ICD-10-CM | POA: Diagnosis not present

## 2020-02-25 DIAGNOSIS — D229 Melanocytic nevi, unspecified: Secondary | ICD-10-CM

## 2020-02-25 DIAGNOSIS — L578 Other skin changes due to chronic exposure to nonionizing radiation: Secondary | ICD-10-CM

## 2020-02-25 NOTE — Patient Instructions (Signed)

## 2020-02-25 NOTE — Progress Notes (Addendum)
   Follow-Up Visit   Subjective  Sandra Stevens is a 69 y.o. female who presents for the following: Annual Exam (left upper inner arm isk?). No history of mm or FH of MM.   The following portions of the chart were reviewed this encounter and updated as appropriate: Tobacco  Allergies  Meds  Problems  Med Hx  Surg Hx  Fam Hx      Objective  Well appearing patient in no apparent distress; mood and affect are within normal limits.  A focused examination was performed including waist up.. Relevant physical exam findings are noted in the Assessment and Plan.  Objective  Left Upper Arm - Anterior: Bichromic dark nested macule.  11.5 to brown spot     Objective  Head - to toe: No signs of DN  Assessment & Plan  Neoplasm of uncertain behavior of skin Left Upper Arm - Anterior  Skin / nail biopsy Type of biopsy: tangential   Informed consent: discussed and consent obtained   Timeout: patient name, date of birth, surgical site, and procedure verified   Anesthesia: the lesion was anesthetized in a standard fashion   Anesthetic:  1% lidocaine w/ epinephrine 1-100,000 local infiltration Instrument used: flexible razor blade   Hemostasis achieved with: aluminum chloride and electrodesiccation   Outcome: patient tolerated procedure well   Post-procedure details: wound care instructions given    Specimen 1 - Surgical pathology Differential Diagnosis: r/o mm Check Margins: yes  Screening exam for skin cancer Head - to toe  Yearly skin exams  Lentigines - Scattered tan macules - Discussed due to sun exposure - Benign, observe - Call for any changes  Seborrheic Keratoses - Stuck-on, waxy, tan-brown papules and plaques  - Discussed benign etiology and prognosis. - Observe - Call for any changes  Melanocytic Nevi - Tan-brown and/or pink-flesh-colored symmetric macules and papules - Benign appearing on exam today - Observation - Call clinic for new or  changing moles - Recommend daily use of broad spectrum spf 30+ sunscreen to sun-exposed areas.   Hemangiomas - Red papules - Discussed benign nature - Observe - Call for any changes  Actinic Damage - diffuse scaly erythematous macules with underlying dyspigmentation - Recommend daily broad spectrum sunscreen SPF 30+ to sun-exposed areas, reapply every 2 hours as needed.  - Call for new or changing lesions.  Skin cancer screening performed today.

## 2020-03-03 ENCOUNTER — Telehealth: Payer: Self-pay | Admitting: *Deleted

## 2020-03-03 NOTE — Telephone Encounter (Signed)
Path to patient  

## 2020-03-04 DIAGNOSIS — H40013 Open angle with borderline findings, low risk, bilateral: Secondary | ICD-10-CM | POA: Diagnosis not present

## 2020-03-04 DIAGNOSIS — H5203 Hypermetropia, bilateral: Secondary | ICD-10-CM | POA: Diagnosis not present

## 2020-03-04 DIAGNOSIS — H17823 Peripheral opacity of cornea, bilateral: Secondary | ICD-10-CM | POA: Diagnosis not present

## 2020-03-04 DIAGNOSIS — H2513 Age-related nuclear cataract, bilateral: Secondary | ICD-10-CM | POA: Diagnosis not present

## 2020-03-07 DIAGNOSIS — H524 Presbyopia: Secondary | ICD-10-CM | POA: Diagnosis not present

## 2020-03-11 DIAGNOSIS — H524 Presbyopia: Secondary | ICD-10-CM | POA: Diagnosis not present

## 2020-03-27 NOTE — Addendum Note (Signed)
Addended by: Robyne Askew R on: 03/27/2020 05:31 PM   Modules accepted: Level of Service

## 2020-03-31 DIAGNOSIS — M1811 Unilateral primary osteoarthritis of first carpometacarpal joint, right hand: Secondary | ICD-10-CM | POA: Diagnosis not present

## 2020-03-31 DIAGNOSIS — M79641 Pain in right hand: Secondary | ICD-10-CM | POA: Diagnosis not present

## 2020-04-26 ENCOUNTER — Other Ambulatory Visit: Payer: Self-pay | Admitting: Internal Medicine

## 2020-04-26 DIAGNOSIS — Z1231 Encounter for screening mammogram for malignant neoplasm of breast: Secondary | ICD-10-CM

## 2020-04-28 DIAGNOSIS — Z96641 Presence of right artificial hip joint: Secondary | ICD-10-CM | POA: Diagnosis not present

## 2020-04-28 DIAGNOSIS — Z471 Aftercare following joint replacement surgery: Secondary | ICD-10-CM | POA: Diagnosis not present

## 2020-06-01 ENCOUNTER — Ambulatory Visit
Admission: RE | Admit: 2020-06-01 | Discharge: 2020-06-01 | Disposition: A | Discharge status: Home / Self Care | Payer: Medicare Other | Source: Ambulatory Visit | Attending: Internal Medicine | Admitting: Internal Medicine

## 2020-06-01 ENCOUNTER — Other Ambulatory Visit: Payer: Self-pay

## 2020-06-01 DIAGNOSIS — Z1231 Encounter for screening mammogram for malignant neoplasm of breast: Secondary | ICD-10-CM | POA: Diagnosis not present

## 2020-06-02 DIAGNOSIS — Z20822 Contact with and (suspected) exposure to covid-19: Secondary | ICD-10-CM | POA: Diagnosis not present

## 2020-06-07 DIAGNOSIS — Z0120 Encounter for dental examination and cleaning without abnormal findings: Secondary | ICD-10-CM | POA: Diagnosis not present

## 2020-08-10 DIAGNOSIS — I10 Essential (primary) hypertension: Secondary | ICD-10-CM | POA: Diagnosis not present

## 2020-08-15 DIAGNOSIS — Z0000 Encounter for general adult medical examination without abnormal findings: Secondary | ICD-10-CM | POA: Diagnosis not present

## 2020-08-15 DIAGNOSIS — I10 Essential (primary) hypertension: Secondary | ICD-10-CM | POA: Diagnosis not present

## 2020-08-15 DIAGNOSIS — Z6839 Body mass index (BMI) 39.0-39.9, adult: Secondary | ICD-10-CM | POA: Diagnosis not present

## 2020-08-15 DIAGNOSIS — Z0001 Encounter for general adult medical examination with abnormal findings: Secondary | ICD-10-CM | POA: Diagnosis not present

## 2020-08-24 DIAGNOSIS — Z6839 Body mass index (BMI) 39.0-39.9, adult: Secondary | ICD-10-CM | POA: Diagnosis not present

## 2020-08-24 DIAGNOSIS — Z01419 Encounter for gynecological examination (general) (routine) without abnormal findings: Secondary | ICD-10-CM | POA: Diagnosis not present

## 2020-11-25 DIAGNOSIS — Z20822 Contact with and (suspected) exposure to covid-19: Secondary | ICD-10-CM | POA: Diagnosis not present

## 2021-02-20 IMAGING — MG DIGITAL SCREENING BILAT W/ TOMO W/ CAD
8 of 14 series · 8 of 40 positions shown · non-contrast
Comparison: Previous exam(s).

CLINICAL DATA: Screening.

EXAM:
DIGITAL SCREENING BILATERAL MAMMOGRAM WITH TOMO AND CAD

[R MLO synth-2D (1 of 2)]
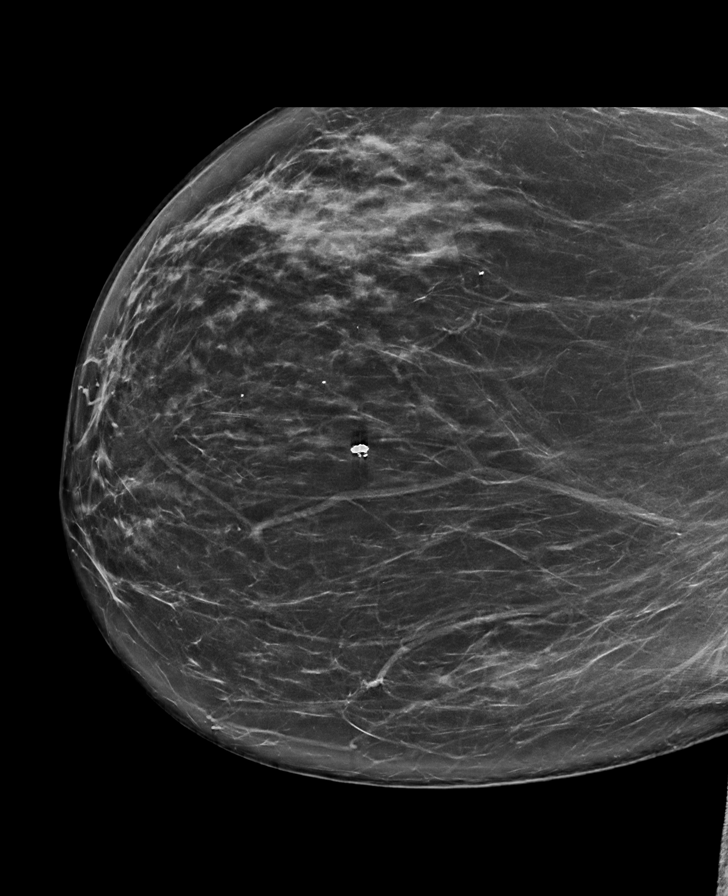

[L CC synth-2D]
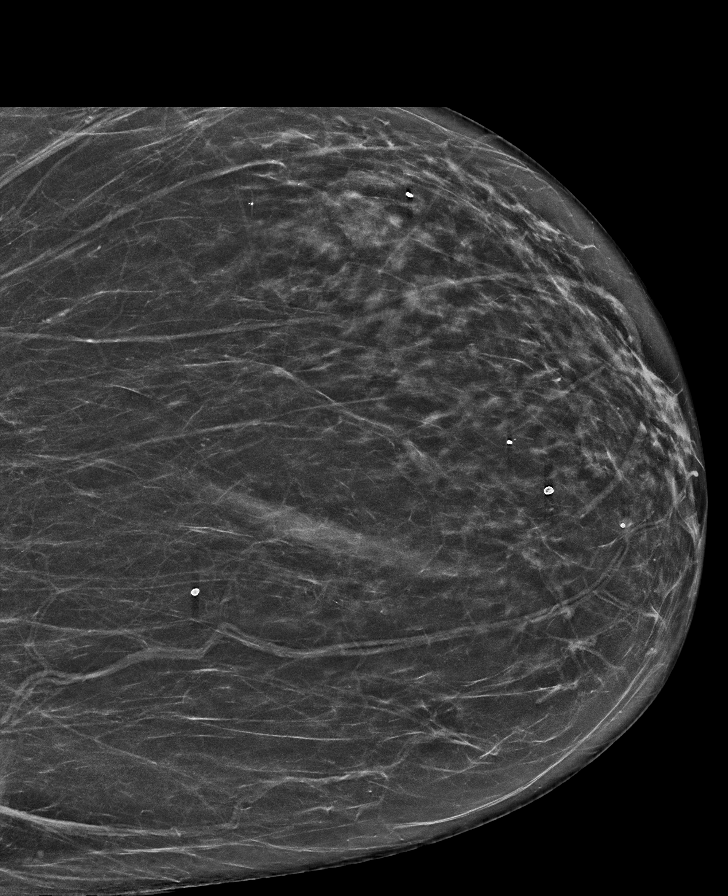

[L MLO synth-2D (1 of 2)]
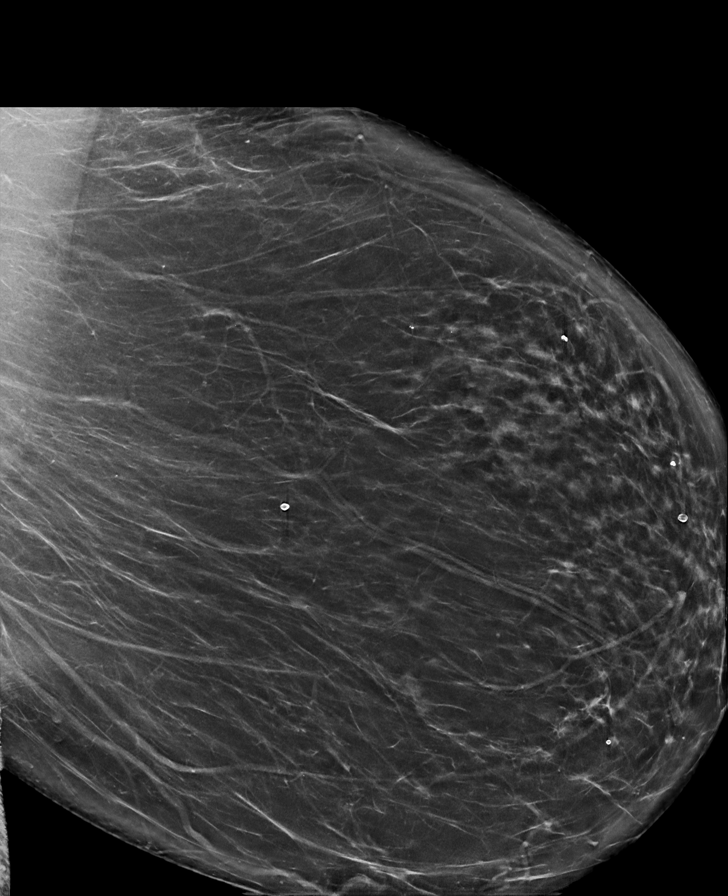

[R CC synth-2D]
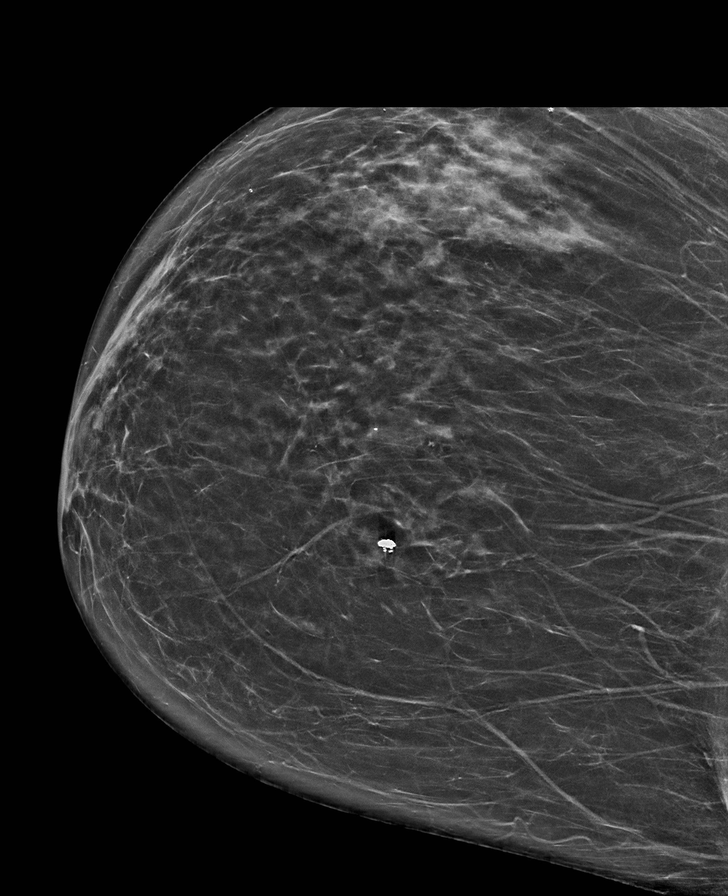

[L MLO synth-2D (2 of 2)]
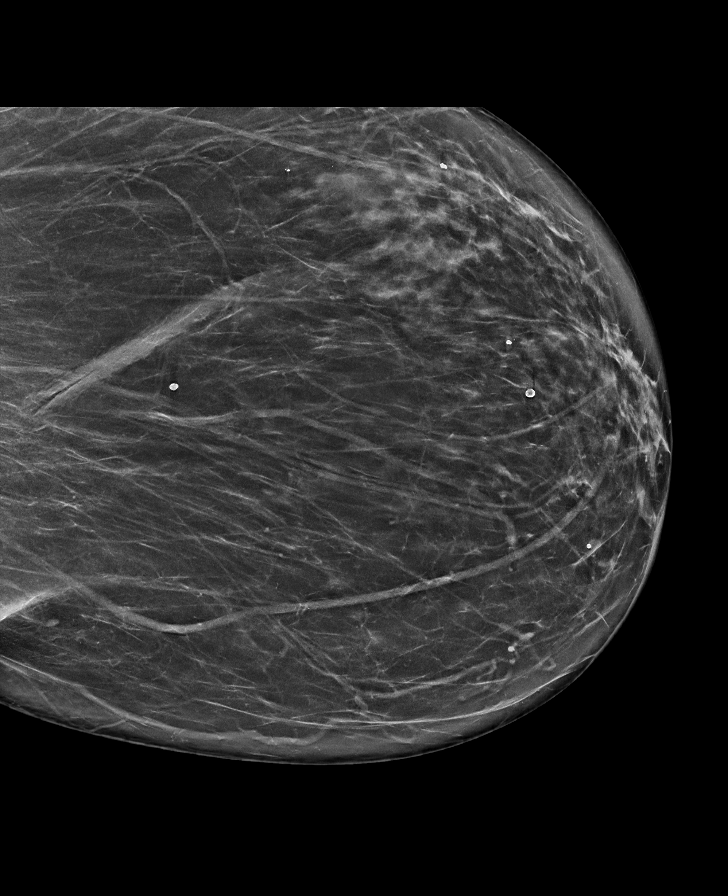

[L CV synth-2D]
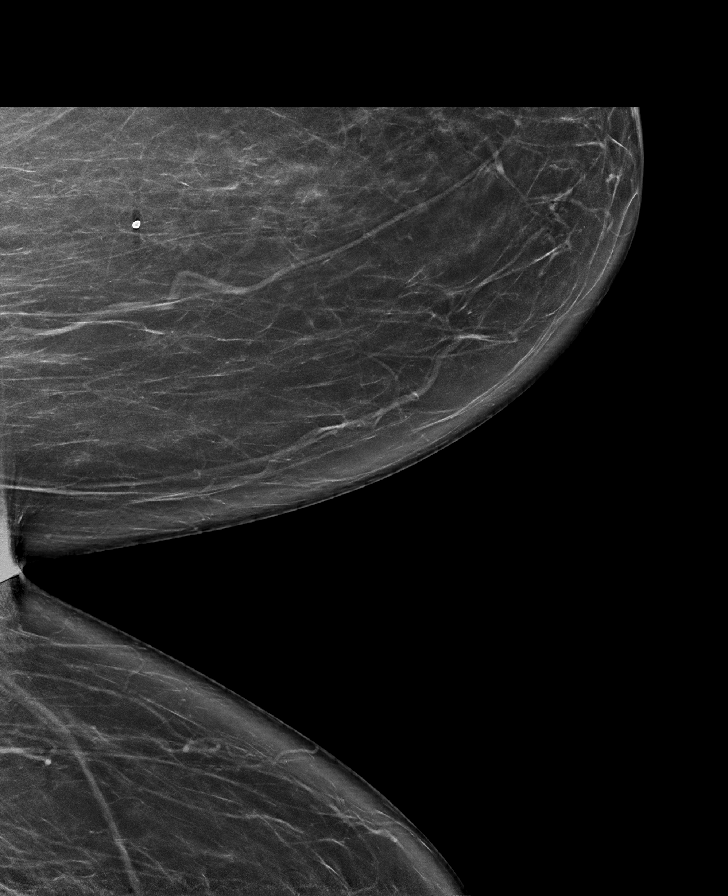

[R MLO synth-2D (2 of 2)]
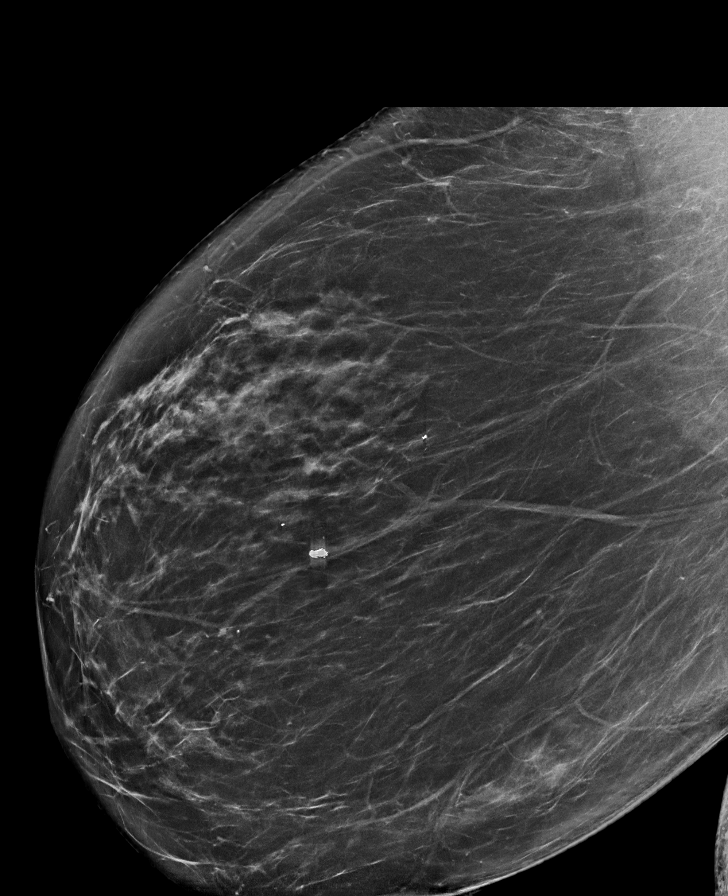

[L MLO tomo · tomo slice 44/87.0]
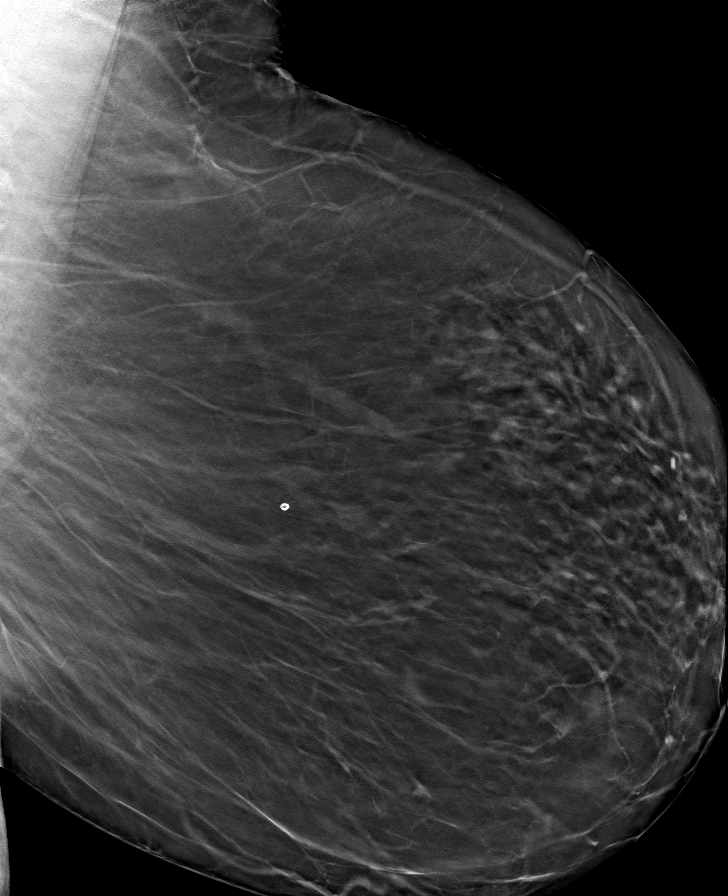

[8 of 40 positions shown; findings below may reference images not displayed]

ACR Breast Density Category b: There are scattered areas of
fibroglandular density.
FINDINGS: There are no findings suspicious for malignancy. Images were
processed with CAD.
IMPRESSION: No mammographic evidence of malignancy. A result letter of this
screening mammogram will be mailed directly to the patient.

RECOMMENDATION:
Screening mammogram in one year. (Code:[id number])

BI-RADS CATEGORY  1: Negative.

## 2021-03-07 DIAGNOSIS — H40013 Open angle with borderline findings, low risk, bilateral: Secondary | ICD-10-CM | POA: Diagnosis not present

## 2021-03-07 DIAGNOSIS — H2513 Age-related nuclear cataract, bilateral: Secondary | ICD-10-CM | POA: Diagnosis not present

## 2021-03-07 DIAGNOSIS — H5203 Hypermetropia, bilateral: Secondary | ICD-10-CM | POA: Diagnosis not present

## 2021-03-07 DIAGNOSIS — H17823 Peripheral opacity of cornea, bilateral: Secondary | ICD-10-CM | POA: Diagnosis not present

## 2021-04-18 ENCOUNTER — Other Ambulatory Visit: Payer: Self-pay | Admitting: Internal Medicine

## 2021-04-18 DIAGNOSIS — Z1231 Encounter for screening mammogram for malignant neoplasm of breast: Secondary | ICD-10-CM

## 2021-06-08 ENCOUNTER — Ambulatory Visit
Admission: RE | Admit: 2021-06-08 | Discharge: 2021-06-08 | Disposition: A | Discharge status: Home / Self Care | Payer: Medicare Other | Source: Ambulatory Visit | Attending: Internal Medicine | Admitting: Internal Medicine

## 2021-06-08 ENCOUNTER — Other Ambulatory Visit: Payer: Self-pay

## 2021-06-08 DIAGNOSIS — Z1231 Encounter for screening mammogram for malignant neoplasm of breast: Secondary | ICD-10-CM

## 2021-08-16 DIAGNOSIS — I10 Essential (primary) hypertension: Secondary | ICD-10-CM | POA: Diagnosis not present

## 2021-08-16 DIAGNOSIS — Z791 Long term (current) use of non-steroidal anti-inflammatories (NSAID): Secondary | ICD-10-CM | POA: Diagnosis not present

## 2021-08-21 DIAGNOSIS — E7800 Pure hypercholesterolemia, unspecified: Secondary | ICD-10-CM | POA: Diagnosis not present

## 2021-08-21 DIAGNOSIS — N1831 Chronic kidney disease, stage 3a: Secondary | ICD-10-CM | POA: Diagnosis not present

## 2021-08-21 DIAGNOSIS — I10 Essential (primary) hypertension: Secondary | ICD-10-CM | POA: Diagnosis not present

## 2021-08-21 DIAGNOSIS — Z0000 Encounter for general adult medical examination without abnormal findings: Secondary | ICD-10-CM | POA: Diagnosis not present

## 2021-08-23 ENCOUNTER — Other Ambulatory Visit: Payer: Self-pay | Admitting: Internal Medicine

## 2021-08-23 DIAGNOSIS — E7800 Pure hypercholesterolemia, unspecified: Secondary | ICD-10-CM

## 2021-08-29 DIAGNOSIS — M8588 Other specified disorders of bone density and structure, other site: Secondary | ICD-10-CM | POA: Diagnosis not present

## 2021-08-29 DIAGNOSIS — Z6841 Body Mass Index (BMI) 40.0 and over, adult: Secondary | ICD-10-CM | POA: Diagnosis not present

## 2021-08-29 DIAGNOSIS — Z01419 Encounter for gynecological examination (general) (routine) without abnormal findings: Secondary | ICD-10-CM | POA: Diagnosis not present

## 2021-08-29 DIAGNOSIS — N958 Other specified menopausal and perimenopausal disorders: Secondary | ICD-10-CM | POA: Diagnosis not present

## 2021-09-12 ENCOUNTER — Ambulatory Visit
Admission: RE | Admit: 2021-09-12 | Discharge: 2021-09-12 | Disposition: A | Discharge status: Home / Self Care | Payer: No Typology Code available for payment source | Source: Ambulatory Visit | Attending: Internal Medicine | Admitting: Internal Medicine

## 2021-09-12 DIAGNOSIS — E7800 Pure hypercholesterolemia, unspecified: Secondary | ICD-10-CM

## 2021-10-02 DIAGNOSIS — I2510 Atherosclerotic heart disease of native coronary artery without angina pectoris: Secondary | ICD-10-CM | POA: Diagnosis not present

## 2021-10-02 DIAGNOSIS — I2584 Coronary atherosclerosis due to calcified coronary lesion: Secondary | ICD-10-CM | POA: Diagnosis not present

## 2021-10-05 ENCOUNTER — Telehealth: Payer: Self-pay

## 2021-10-05 NOTE — Telephone Encounter (Signed)
SCANNED NOTES TO REFERRAL

## 2021-10-11 DIAGNOSIS — S8011XA Contusion of right lower leg, initial encounter: Secondary | ICD-10-CM | POA: Diagnosis not present

## 2021-10-12 ENCOUNTER — Ambulatory Visit: Payer: Self-pay

## 2021-10-12 ENCOUNTER — Other Ambulatory Visit: Payer: Self-pay

## 2021-10-12 ENCOUNTER — Ambulatory Visit (INDEPENDENT_AMBULATORY_CARE_PROVIDER_SITE_OTHER): Payer: Medicare Other

## 2021-10-12 ENCOUNTER — Ambulatory Visit: Payer: Medicare Other | Admitting: Family Medicine

## 2021-10-12 ENCOUNTER — Encounter: Payer: Self-pay | Admitting: Family Medicine

## 2021-10-12 VITALS — BP 102/70 | HR 77 | Ht 65.0 in | Wt 246.4 lb

## 2021-10-12 DIAGNOSIS — M79661 Pain in right lower leg: Secondary | ICD-10-CM

## 2021-10-12 DIAGNOSIS — S82891A Other fracture of right lower leg, initial encounter for closed fracture: Secondary | ICD-10-CM

## 2021-10-12 DIAGNOSIS — M7989 Other specified soft tissue disorders: Secondary | ICD-10-CM

## 2021-10-12 NOTE — Patient Instructions (Addendum)
Nice to meet you.  Wear a walking boot to immobilize your ankle and lower leg.  Remember not to drive with the walking boot on.  Please go to [facility] supply to get the knee scooter we talked about today. You may also be able to get it from [employer].    Follow-up: 2 weeks and plan to re-XR at the beginning of your visit  Happy Holidays!

## 2021-10-12 NOTE — Progress Notes (Signed)
Subjective:    CC: R lower leg pain  I, Molly Weber, LAT, ATC, am serving as scribe for Dr. Lynne Leader.  HPI: Pt is a 70 y/o female presenting w/ c/o R lower leg pain since last Sunday, Oct 08, 2021 when she woke up from having a cramp in her R foot.  She got out of bed and as soon as she stood up she fell to the floor due to the pain in her R lateral lower leg and R foot.  She locates her pain to .  She's leaving to today to drive to [employer].  Her father died just recently and her mother is in a nursing home.  Radiating pain: yes from her R lateral lower leg R lower leg swelling: yes w/ bruising along her R lateral lower leg R leg numbness/tingling: no Aggravating factors:  walking; R ankle DF and PF AROM Treatments tried: rest and elevation  Pertinent review of Systems: No fevers or chills  Relevant historical information: Hypertension. Recent bone density test was reportedly acceptable.   Objective:    Vitals:   10/12/21 0856  BP: 102/70  Pulse: 77  SpO2: 97%   General: Well Developed, well nourished, and in no acute distress.   MSK: Right foot and ankle swollen with bruising laterally.  Tender palpation lateral malleolus.  Sensation intact distally.  Capillary fill intact distally.  Lab and Radiology Results  X-ray images right ankle obtained today personally and independently interpreted. Oblique fracture distal fibula with minimal displacement of ankle mortise. Await formal radiology review    Impression and Recommendations:    Assessment and Plan: 70 y.o. female with right ankle fracture at distal fibula.  Fracture appears to be reasonably aligned to avoid surgery.  Plan to mobilize with cam walker boot and proceed with nonweightbearing.  Recheck in 2 weeks.  Discussed nonweightbearing strategies of a walker and with a knee scooter.  Patient already has a long cam walker boot at home which she can use in a walker at home.  She is traveling to  [place] today to arrange things after her father's death.  She obviously is going to have to do a lot of activity and probably will have some weightbearing.  I think it is reasonable to except a little bit of necessity of life with this fracture.  Advised her to not drive with a cam walker boot on her right foot.  I think it safer for her to take the cam walker boot off with driving that it is to try to drive with a cam walker boot on her right foot.  She is planning to use Tylenol and ibuprofen for pain control.  I offered narcotics and she declined.  Happy to prescribe them tomorrow if she decides that she needs them while she is in [place].  Advised that the clinic is closed after about noon tomorrow.  PDMP not reviewed this encounter. Orders Placed This Encounter  Procedures   DG Ankle Complete Right    Standing Status:   Future    Number of Occurrences:   1    Standing Expiration Date:   11/12/2021    Order Specific Question:   Reason for Exam (SYMPTOM  OR DIAGNOSIS REQUIRED)    Answer:   R lateral lower leg pain    Order Specific Question:   Preferred imaging location?    Answer:   [facility]   No orders of the defined types were placed in this  encounter.   Discussed warning signs or symptoms. Please see discharge instructions. Patient expresses understanding.   The above documentation has been reviewed and is accurate and complete Lynne Leader, M.D.

## 2021-10-13 NOTE — Progress Notes (Signed)
Ankle fracture is visible like we discussed in clinic.  Radiology agrees that it is mildly displaced.

## 2021-10-26 ENCOUNTER — Ambulatory Visit: Payer: Medicare Other | Admitting: Family Medicine

## 2021-10-26 ENCOUNTER — Ambulatory Visit (INDEPENDENT_AMBULATORY_CARE_PROVIDER_SITE_OTHER): Payer: Medicare Other

## 2021-10-26 ENCOUNTER — Other Ambulatory Visit: Payer: Self-pay

## 2021-10-26 ENCOUNTER — Encounter: Payer: Self-pay | Admitting: Family Medicine

## 2021-10-26 ENCOUNTER — Encounter: Payer: Self-pay | Admitting: Internal Medicine

## 2021-10-26 VITALS — BP 124/78 | HR 75 | Ht 65.0 in | Wt 251.2 lb

## 2021-10-26 DIAGNOSIS — S82891D Other fracture of right lower leg, subsequent encounter for closed fracture with routine healing: Secondary | ICD-10-CM | POA: Diagnosis not present

## 2021-10-26 DIAGNOSIS — M25561 Pain in right knee: Secondary | ICD-10-CM

## 2021-10-26 DIAGNOSIS — M7989 Other specified soft tissue disorders: Secondary | ICD-10-CM | POA: Diagnosis not present

## 2021-10-26 NOTE — Patient Instructions (Addendum)
Good to see you.  Wear your new walking boot.  Walk w/ a walker instead of your cane as tolerated.  Follow-up: 3 weeks and will re-Xray at the beginning of your next visit  Subsequently, please get R knee Xray on your way out.

## 2021-10-26 NOTE — Progress Notes (Signed)
I, Wendy Poet, LAT, ATC, am serving as scribe for Dr. Lynne Leader.  Isabelle Matt is a 71 y.o. female who presents to [facility] today for f/u fx of the R fibula that she injured on 12/18. Pt was last seen by Dr. Georgina Snell on 10/12/21 and was advised to immobilize w/ a CAM walker boot and be NWB. Pt was also advised to not drive w/ the CAM walker boot on and use Tylenol or IBU for pain control. Today, pt reports that her R lateral lower leg is feeling better.  She notes that her bruising is resolving.  She rates her current improvement at 50%.  She is not having to take any medication for pain.  She has been walking a fair amount on her ankle without the cam walker boot or a walker.  She will use a cane.  She has trouble getting her lower leg to fit into her cam walker boot that she has at home.   Of note following the visit where she was given a long cam walker boot as she was leaving the building she felt her right knee become painful or give way.  She was placed in a wheelchair and brought back into the clinic by her family member.  She notes anterior to medial knee pain  Dx imaging: 10/26/20, 10/12/21 - R ankle XR  Pertinent review of systems: No fevers or chills  Relevant historical information: Hypertension.  History right knee total knee replacement.   Exam:  BP 124/78 (BP Location: Right Arm, Patient Position: Sitting, Cuff Size: Large)    Pulse 75    Ht 5\' 5"  (1.651 m)    Wt 251 lb 3.2 oz (113.9 kg)    SpO2 99%    BMI 41.80 kg/m  General: Well Developed, well nourished, and in no acute distress.   MSK: Right ankle normal-appearing mildly tender palpation lateral ankle.  Right knee anterior mature scar.  Tender palpation mild anterior knee and anterior medial knee.  Intact strength.  Stable ligamentous exam.      Lab and Radiology Results No results found for this or any previous visit (from the past 72 hour(s)). DG Ankle Complete  Right  Result Date: 10/26/2021 CLINICAL DATA:  Right fibular fracture, follow-up examination EXAM: RIGHT ANKLE - COMPLETE 3+ VIEW COMPARISON:  10/12/2021 FINDINGS: Oblique fracture of the distal fibular metaphyseal region at the level of the tibial plafond is unchanged with 1 cortical with lateral displacement and normal alignment of the distal fracture fragment. Mild resorption along the fracture plane. No significant callus identified. Mild diffuse subcutaneous edema. Mild soft tissue swelling superficial to the lateral malleolus. IMPRESSION: Mild resorption along the fracture plane in keeping with interval but incomplete healing. Normal alignment. Electronically Signed   By: Fidela Salisbury M.D.   On: 10/26/2021 23:48   DG Knee AP/LAT W/Sunrise Right  Result Date: 10/26/2021 CLINICAL DATA:  Right knee pain. EXAM: RIGHT KNEE 3 VIEWS COMPARISON:  None. FINDINGS: There is a right knee arthroplasty in anatomic alignment. There is no evidence for hardware loosening. No evidence of fracture, dislocation, or joint effusion. No evidence of arthropathy or other focal bone abnormality. Soft tissues are unremarkable. IMPRESSION: 1. Right knee arthroplasty, uncomplicated. 2. Electronically Signed   By: Ronney Asters M.D.   On: 10/26/2021 22:20    I, Lynne Leader, personally (independently) visualized and performed the interpretation of the images attached in this note.    Assessment and Plan: 70  y.o. female with  Right ankle fracture.  No change in alignment despite putting some weight and pressure through the ankle.  Patient was fitted with a longer wider cam walker boot but as noted above as she was leaving the clinic had some sort of incident with her knee.  I think fundamentally the larger cam walker boot is acting like a lever and causing her knee to experience too much torque and pain and she will not tolerate this.  We will attempt nonweightbearing or minimal weightbearing with current cam walker boot at home  and a walker.  Recheck in 2 to 3 weeks.  Right knee pain.  Difficult to tell for now.  X-ray does not show obvious fracture or injury of the hardware/bone interface.  Reassess in 2 to 3 weeks.   PDMP not reviewed this encounter. Orders Placed This Encounter  Procedures   DG Ankle Complete Right    Standing Status:   Future    Number of Occurrences:   1    Standing Expiration Date:   11/26/2021    Order Specific Question:   Reason for Exam (SYMPTOM  OR DIAGNOSIS REQUIRED)    Answer:   R fibular fracture    Order Specific Question:   Preferred imaging location?    Answer:   [facility]   DG Knee AP/LAT W/Sunrise Right    Standing Status:   Future    Number of Occurrences:   1    Standing Expiration Date:   11/26/2021    Order Specific Question:   Reason for Exam (SYMPTOM  OR DIAGNOSIS REQUIRED)    Answer:   R knee pain    Order Specific Question:   Preferred imaging location?    Answer:   [facility]   No orders of the defined types were placed in this encounter.    Discussed warning signs or symptoms. Please see discharge instructions. Patient expresses understanding.   The above documentation has been reviewed and is accurate and complete Lynne Leader, M.D.

## 2021-10-27 NOTE — Progress Notes (Signed)
Right knee x-ray shows normal-appearing knee replacement hardware with no fractures.

## 2021-10-27 NOTE — Progress Notes (Signed)
Right ankle shows fracture has not changed in alignment or displacement.  It shows signs of early healing.

## 2021-11-09 ENCOUNTER — Encounter: Payer: Self-pay | Admitting: Cardiology

## 2021-11-09 NOTE — Progress Notes (Signed)
Cardiology Office Note   Date:  11/10/2021   ID:  Elira, Colasanti 1951/03/27, MRN [id number]  PCP:  Deland Pretty, MD  Cardiologist:   Minus Breeding, MD Referring:  Deland Pretty, MD  Chief Complaint  Patient presents with   Shortness of Breath      History of Present Illness: Sandra Stevens is a 71 y.o. female who is referred for evaluation of elevated coronary calcium.  The score was 1,927 that is 99th percentile.   She has no past cardiac history.  I did look back in 2013 and it was a CT to rule out pulmonary embolism and suggested some calcium in her coronaries.  She unfortunately has been limited recently due to broken ankle.  Prior to that she said she was walking miles a day.  She might get short of breath with this but she did not think this was at a portion to anything she been experiencing.  She does not have any resting shortness of breath, PND or orthopnea.  She has no palpitations, presyncope or syncope.  She does not chest pressure, neck or discomfort.  He has had no new edema.  She has not had any prior cardiac testing other than a distant treadmill test.   Past Medical History:  Diagnosis Date   Arthritis    hx. arthritis, DDD, bursitis(hips)   Coronary artery calcification    Cystitis    past hx none recently.   GERD (gastroesophageal reflux disease)    control by med.   Hemorrhoid    HTN (hypertension)    no meds required per Dr Linna Darner   PONV (postoperative nausea and vomiting)    nausea   Sleep apnea    Not on CPAP.  On O2   Varicose vein of leg     Past Surgical History:  Procedure Laterality Date   ANKLE SURGERY  Nov 2012   COLONOSCOPY  2010   Dr Henrene Pastor   COLONOSCOPY W/ POLYPECTOMY     done every 5 years due to [place] colon cancer   DILATION AND CURETTAGE OF UTERUS     endometrial septum  06/11/12   Dr Radene Knee   EYE SURGERY     bilateral lasik eye surgery   facet injections  11/03/13   L4-5,L5-S1; Dr Nelva Bush   KNEE SURGERY     x7    [place] Right    '96   POLYPECTOMY     TARSAL TUNNEL RELEASE     TONSILLECTOMY     TOTAL HIP ARTHROPLASTY Right 08/01/2016   Procedure: RIGHT TOTAL HIP ARTHROPLASTY ANTERIOR APPROACH;  Surgeon: Gaynelle Arabian, MD;  Location: [facility];  Service: Orthopedics;  Laterality: Right;   TOTAL KNEE ARTHROPLASTY  '04 / '05   x2- bilateral.   WISDOM TOOTH EXTRACTION     WRIST SURGERY     x2     Current Outpatient Medications  Medication Sig Dispense Refill   aspirin (ASPIRIN 81) 81 MG chewable tablet Chew 81 mg by mouth daily.     metoprolol tartrate (LOPRESSOR) 100 MG tablet Take one tablet two hours prior to the test. 1 tablet 0   rosuvastatin (CRESTOR) 20 MG tablet Take 20 mg by mouth daily.     amitriptyline (ELAVIL) 10 MG tablet TAKE 2 TABLETS BY MOUTH AT BEDTIME. 60 tablet 0   cholecalciferol (VITAMIN D) 25 MCG (1000 UNIT) tablet Take 25 mcg by mouth every other day.     gabapentin (NEURONTIN)  400 MG capsule Take 1 capsule (400 mg total) by mouth 3 (three) times daily. (Patient taking differently: Take 400 mg by mouth at bedtime.) 270 capsule 2   irbesartan-hydrochlorothiazide (AVALIDE) 300-12.5 MG tablet Take 1 tablet by mouth daily.     pantoprazole (PROTONIX) 40 MG tablet Take 40 mg by mouth daily.     zaleplon (SONATA) 10 MG capsule   0   No current facility-administered medications for this visit.    Allergies:   Patient has no known allergies.    Social History:  The patient  reports that she quit smoking about 23 years ago. Her smoking use included cigarettes. She has a 10.00 pack-year smoking history. She has never used smokeless tobacco. She reports current alcohol use. She reports that she does not use drugs.   Family History:  The patient's family history includes Colon cancer in her cousin, father, and maternal grandmother; Hypertension in an other family member.    ROS:  Please see the history of present illness.   Otherwise, review of systems are  positive for none.   All other systems are reviewed and negative.    PHYSICAL EXAM: VS:  BP 124/68    Pulse 78    Ht 5\' 5"  (1.651 m)    Wt 250 lb 3.2 oz (113.5 kg)    SpO2 98%    BMI 41.64 kg/m  , BMI Body mass index is 41.64 kg/m. GENERAL:  Well appearing HEENT:  Pupils equal round and reactive, fundi not visualized, oral mucosa unremarkable NECK:  No jugular venous distention, waveform within normal limits, carotid upstroke brisk and symmetric, no bruits, no thyromegaly LYMPHATICS:  No cervical, inguinal adenopathy LUNGS:  Clear to auscultation bilaterally BACK:  No CVA tenderness CHEST:  Unremarkable HEART:  PMI not displaced or sustained,S1 and S2 within normal limits, no S3, no S4, no clicks, no rubs, no murmurs ABD:  Flat, positive bowel sounds normal in frequency in pitch, no bruits, no rebound, no guarding, no midline pulsatile mass, no hepatomegaly, no splenomegaly EXT:  2 plus pulses throughout, no edema, no cyanosis no clubbing SKIN:  No rashes no nodules NEURO:  Cranial nerves II through XII grossly intact, motor grossly intact throughout PSYCH:  Cognitively intact, oriented to person place and time    EKG:  EKG is ordered today. The ekg ordered today demonstrates sinus rhythm, rate 78, poor anterior R wave progression, leftward axis, no acute ST-T wave changes.   Recent Labs: No results found for requested labs within last 8760 hours.    Lipid Panel    Component Value Date/Time   CHOL 160 08/04/2012 0949   TRIG 74.0 08/04/2012 0949   HDL 49.00 08/04/2012 0949   CHOLHDL 3 08/04/2012 0949   VLDL 14.8 08/04/2012 0949   LDLCALC 96 08/04/2012 0949      Wt Readings from Last 3 Encounters:  11/10/21 250 lb 3.2 oz (113.5 kg)  10/26/21 251 lb 3.2 oz (113.9 kg)  10/12/21 246 lb 6.4 oz (111.8 kg)      Other studies Reviewed: Additional studies/ records that were reviewed today include: Labs. Review of the above records demonstrates:  Please see elsewhere in the  note.     ASSESSMENT AND PLAN:  ELEVATED CORONARY CALCIUM: The patient has a markedly elevated coronary calcium.  He does have limited functional capacity and suggesting symptoms is difficult right now.  I would like to proceed with a cardiac CTA and further management will be based on this.  HTN:  Blood pressure is controlled.  She will continue the meds as listed.  DYSLIPIDEMIA: I agree with a goal LDL of at least less than 100.  Her most recent was 141.  She has been started on rosuvastatin.  She will have follow-up with Deland Pretty, MD    Current medicines are reviewed at length with the patient today.  The patient does not have concerns regarding medicines.  The following changes have been made:  no change  Labs/ tests ordered today include: Amiodarone preventive medicine presents  Orders Placed This Encounter  Procedures   CT CORONARY MORPH W/CTA COR W/SCORE W/CA W/CM &/OR WO/CM   Basic metabolic panel   EKG 01-UYWS     Disposition:   FU with me based on results of the above.   Signed, Minus Breeding, MD  11/10/2021 2:48 PM    [facility]

## 2021-11-10 ENCOUNTER — Encounter: Payer: Self-pay | Admitting: Cardiology

## 2021-11-10 ENCOUNTER — Other Ambulatory Visit: Payer: Self-pay

## 2021-11-10 ENCOUNTER — Ambulatory Visit (INDEPENDENT_AMBULATORY_CARE_PROVIDER_SITE_OTHER): Payer: Medicare Other | Admitting: Cardiology

## 2021-11-10 VITALS — BP 124/68 | HR 78 | Ht 65.0 in | Wt 250.2 lb

## 2021-11-10 DIAGNOSIS — I10 Essential (primary) hypertension: Secondary | ICD-10-CM | POA: Diagnosis not present

## 2021-11-10 DIAGNOSIS — R931 Abnormal findings on diagnostic imaging of heart and coronary circulation: Secondary | ICD-10-CM | POA: Diagnosis not present

## 2021-11-10 MED ORDER — METOPROLOL TARTRATE 100 MG PO TABS: ORAL_TABLET | ORAL | 0 refills | Status: DC

## 2021-11-10 NOTE — Patient Instructions (Signed)
Medication Instructions:  No changes *If you need a refill on your cardiac medications before your next appointment, please call your pharmacy*   Lab Work: None ordered If you have labs (blood work) drawn today and your tests are completely normal, you will receive your results only by: [place] (if you have MyChart) OR A paper copy in the mail If you have any lab test that is abnormal or we need to change your treatment, we will call you to review the results.  Follow-Up: At [facility], you and your health needs are our priority.  As part of our continuing mission to provide you with exceptional heart care, we have created designated Provider Care Teams.  These Care Teams include your primary Cardiologist (physician) and Advanced Practice Providers (APPs -  Physician Assistants and Nurse Practitioners) who all work together to provide you with the care you need, when you need it.  We recommend signing up for the patient portal called "MyChart".  Sign up information is provided on this After Visit Summary.  MyChart is used to connect with patients for Virtual Visits (Telemedicine).  Patients are able to view lab/test results, encounter notes, upcoming appointments, etc.  Non-urgent messages can be sent to your provider as well.   To learn more about what you can do with MyChart, go to [email or web address].    Your next appointment:   Pending results  Other Instructions   Your cardiac CT will be scheduled at one of the below locations:   [facility] [address] [place], [place] [address] [phone]  [place]  [address] [place], [place] [address] [phone]  If scheduled at [facility], please arrive at the [facility] main entrance (entrance A) of [facility] 30 minutes prior to test start time. You can use the FREE valet parking offered at the main entrance (encouraged to  control the heart rate for the test) Proceed to the [facility] Radiology Department (first floor) to check-in and test prep.  If scheduled at [facility], please arrive 15 mins early for check-in and test prep.  Please follow these instructions carefully (unless otherwise directed):   On the Night Before the Test: Be sure to Drink plenty of water. Do not consume any caffeinated/decaffeinated beverages or chocolate 12 hours prior to your test. Do not take any antihistamines 12 hours prior to your test.  On the Day of the Test: Drink plenty of water until 1 hour prior to the test. Do not eat any food 4 hours prior to the test. You may take your regular medications prior to the test.  Take metoprolol (Lopressor) two hours prior to test. HOLD Hydrochlorothiazide morning of the test. FEMALES- please wear underwire-free bra if available, avoid dresses & tight clothing      After the Test: Drink plenty of water. After receiving IV contrast, you may experience a mild flushed feeling. This is normal. On occasion, you may experience a mild rash up to 24 hours after the test. This is not dangerous. If this occurs, you can take Benadryl 25 mg and increase your fluid intake. If you experience trouble breathing, this can be serious. If it is severe call 911 IMMEDIATELY. If it is mild, please call our office. If you take any of these medications: Glipizide/Metformin, Avandament, Glucavance, please do not take 48 hours after completing test unless otherwise instructed.  We will call to schedule your test 2-4  weeks out understanding that some insurance companies will need an authorization prior to the service being performed.   For non-scheduling related questions, please contact the cardiac imaging nurse navigator should you have any questions/concerns: Marchia Bond, Cardiac Imaging Nurse Navigator Gordy Clement, Cardiac Imaging Nurse Navigator [facility] Direct Office Dial: [phone]   For scheduling needs, including cancellations and rescheduling, please call [place], [phone].

## 2021-11-13 ENCOUNTER — Other Ambulatory Visit: Payer: Self-pay | Admitting: *Deleted

## 2021-11-13 MED ORDER — METOPROLOL TARTRATE 100 MG PO TABS: ORAL_TABLET | ORAL | 0 refills | Status: DC

## 2021-11-17 ENCOUNTER — Encounter: Payer: Self-pay | Admitting: Family Medicine

## 2021-11-17 ENCOUNTER — Ambulatory Visit (INDEPENDENT_AMBULATORY_CARE_PROVIDER_SITE_OTHER): Payer: Medicare Other

## 2021-11-17 ENCOUNTER — Telehealth (HOSPITAL_COMMUNITY): Payer: Self-pay | Admitting: Emergency Medicine

## 2021-11-17 ENCOUNTER — Other Ambulatory Visit: Payer: Self-pay

## 2021-11-17 ENCOUNTER — Other Ambulatory Visit (HOSPITAL_COMMUNITY): Payer: Self-pay | Admitting: *Deleted

## 2021-11-17 ENCOUNTER — Ambulatory Visit (INDEPENDENT_AMBULATORY_CARE_PROVIDER_SITE_OTHER): Payer: Medicare Other | Admitting: Family Medicine

## 2021-11-17 ENCOUNTER — Telehealth (HOSPITAL_COMMUNITY): Payer: Self-pay | Admitting: *Deleted

## 2021-11-17 VITALS — BP 112/78 | HR 73 | Ht 65.0 in | Wt 249.8 lb

## 2021-11-17 DIAGNOSIS — R931 Abnormal findings on diagnostic imaging of heart and coronary circulation: Secondary | ICD-10-CM

## 2021-11-17 DIAGNOSIS — S82891D Other fracture of right lower leg, subsequent encounter for closed fracture with routine healing: Secondary | ICD-10-CM | POA: Diagnosis not present

## 2021-11-17 DIAGNOSIS — I10 Essential (primary) hypertension: Secondary | ICD-10-CM

## 2021-11-17 DIAGNOSIS — M7732 Calcaneal spur, left foot: Secondary | ICD-10-CM | POA: Diagnosis not present

## 2021-11-17 NOTE — Telephone Encounter (Signed)
Reaching out to patient to offer assistance regarding upcoming cardiac imaging study; pt verbalizes understanding of appt date/time, parking situation and where to check in, pre-test NPO status and medications ordered, and verified current allergies; name and call back number provided for further questions should they arise Marchia Bond RN Navigator Cardiac Imaging [facility] [phone] office [phone] cell  Pt states getting labs at PCP on Monday Denies iv issues Arrival 330  Taking 100mg  metoprolol tartrate  Holding avalide

## 2021-11-17 NOTE — Patient Instructions (Addendum)
Good to see you today.  Try to keep your weight off of it  Follow-up: Recheck back in 3 weeks

## 2021-11-17 NOTE — Telephone Encounter (Signed)
Attempted to call patient regarding upcoming cardiac CT appointment. Voicemail box full and unable to leave a voicemail.  Gordy Clement RN Navigator Cardiac Imaging [facility] [phone] Office [phone] Cell

## 2021-11-17 NOTE — Progress Notes (Signed)
° °  I, Wendy Poet, LAT, ATC, am serving as scribe for Dr. Lynne Leader.  Sandra Stevens is a 71 y.o. female who presents to [place] at [facility] today for f/u fx of the R fibula that she injured on 12/18. Pt was last seen by Dr. Georgina Snell on 10/26/21 and was advised to remain nonweightbearing or minimal weightbearing with current cam walker boot at home and a walker. Of note, following her last visit as she was leaving the building she felt her R knee become painful and gave out, causing her to fall. Today, pt reports that her R ankle/distal lower leg is feeling better, rating her improvement at 80%.  She has pain at night when she tries to roll over in bed and some slight increase in pain w/ prolonged walking/standing.  She has been using both her cane and walker.  Her knee is feeling a lot better.  Dx imaging: 10/26/21 R knee & R ankle XR  10/12/21 R ankle XR  Pertinent review of systems: No fevers or chills  Relevant historical information: Hypertension   Exam:  BP 112/78 (BP Location: Left Arm, Patient Position: Sitting, Cuff Size: Large)    Pulse 73    Ht 5\' 5"  (1.651 m)    Wt 249 lb 12.8 oz (113.3 kg)    SpO2 99%    BMI 41.57 kg/m  General: Well Developed, well nourished, and in no acute distress.   MSK: Right ankle normal-appearing Minimally tender palpation lateral ankle. Motion not tested.    Lab and Radiology Results  X-ray images right ankle obtained today personally and independently interpreted. Bleak fracture distal fibula with minimal displacement.  No significant change in angulation or displacement from x-ray October 26, 2021. Some callus formation is present. Await formal radiology review    Assessment and Plan: 71 y.o. female with right ankle fracture.  Doing well clinically despite an adequate immobilization or inadequate nonweightbearing status. She has developed callus but still has evident fracture line.  Plan to minimize  weightbearing. She was completely intolerant of cam walker boot and nonweightbearing status but is doing well clinically. Recheck in 3 weeks.  Repeat x-ray at that time.   PDMP not reviewed this encounter. Orders Placed This Encounter  Procedures   DG Ankle Complete Right    Standing Status:   Future    Number of Occurrences:   1    Standing Expiration Date:   11/17/2022    Order Specific Question:   Reason for Exam (SYMPTOM  OR DIAGNOSIS REQUIRED)    Answer:   right ankle fracture    Order Specific Question:   Preferred imaging location?    Answer:   [facility]   No orders of the defined types were placed in this encounter.    Discussed warning signs or symptoms. Please see discharge instructions. Patient expresses understanding.   The above documentation has been reviewed and is accurate and complete Lynne Leader, M.D.

## 2021-11-20 ENCOUNTER — Ambulatory Visit (AMBULATORY_SURGERY_CENTER): Payer: Medicare Other | Admitting: *Deleted

## 2021-11-20 ENCOUNTER — Telehealth: Payer: Self-pay | Admitting: *Deleted

## 2021-11-20 ENCOUNTER — Other Ambulatory Visit: Payer: Self-pay

## 2021-11-20 VITALS — Ht 65.0 in | Wt 245.0 lb

## 2021-11-20 DIAGNOSIS — Z800 Family history of malignant neoplasm of digestive organs: Secondary | ICD-10-CM

## 2021-11-20 DIAGNOSIS — Z86010 Personal history of colonic polyps: Secondary | ICD-10-CM

## 2021-11-20 DIAGNOSIS — Z1211 Encounter for screening for malignant neoplasm of colon: Secondary | ICD-10-CM

## 2021-11-20 DIAGNOSIS — I2510 Atherosclerotic heart disease of native coronary artery without angina pectoris: Secondary | ICD-10-CM | POA: Diagnosis not present

## 2021-11-20 MED ORDER — PLENVU 140 G PO SOLR: 1.0000 | ORAL | 0 refills | Status: DC

## 2021-11-20 NOTE — Telephone Encounter (Signed)
Dr Henrene Pastor- Pt is scheduled for a recall colon on 2/9. Pt is on oxygen at night only for sleep apnea. She is not on CPAP. She is also in a [place] drug study for an ozempic like drug. She takes 3 pills daily, does not know if it is a real medication or placebo.  Would you like an office visit or direct admit to hospital?    Thank you, Lattie Haw PV

## 2021-11-20 NOTE — Progress Notes (Signed)
Right ankle shows some callus formation with healing.  This is good news.  This is what we saw in clinic.

## 2021-11-20 NOTE — Progress Notes (Signed)
No egg or soy allergy known to patient  No issues known to pt with past sedation with any surgeries or procedures Patient denies ever being told they had issues or difficulty with intubation  No FH of Malignant Hyperthermia Pt is not on diet pills Pt is not on  home 02  Pt is not on blood thinners  Pt denies issues with constipation  No A fib or A flutter  Pt is fully vaccinated  for Covid  Pt states is on O2 at night and also in an Ozempic like drug study, will send note to Dr Providence Lanius Coupon to pt in PV today , Code to Pharmacy and  NO PA's for preps discussed with pt In PV today  Discussed with pt there will be an out-of-pocket cost for prep and that varies from $0 to 70 +  dollars - pt verbalized understanding   Due to the COVID-19 pandemic we are asking patients to follow certain guidelines in PV and the [place]   Pt aware of COVID protocols and [facility] guidelines   PV completed over the phone. Pt verified name, DOB, address and insurance during PV today.  Pt mailed instruction packet with copy of consent form to read and not return, and instructions.  Pt encouraged to call with questions or issues.  If pt has My chart, procedure instructions sent via My Chart

## 2021-11-20 NOTE — Telephone Encounter (Signed)
Lattie Haw, This patient is cleared for pre-visit and procedure in the [place]. Thanks, Dr. Henrene Pastor

## 2021-11-20 NOTE — Telephone Encounter (Signed)
I am not sure sleep apnea would disqualify her from the [facility].  I will ask our head of anesthesia, Mr. Despina Hick to review.

## 2021-11-21 ENCOUNTER — Ambulatory Visit (HOSPITAL_COMMUNITY): Payer: Medicare Other

## 2021-11-21 ENCOUNTER — Ambulatory Visit (HOSPITAL_COMMUNITY)
Admission: RE | Admit: 2021-11-21 | Discharge: 2021-11-21 | Disposition: A | Discharge status: Home / Self Care | Payer: Medicare Other | Source: Ambulatory Visit | Attending: Cardiology | Admitting: Cardiology

## 2021-11-21 ENCOUNTER — Other Ambulatory Visit: Payer: Self-pay | Admitting: Cardiology

## 2021-11-21 DIAGNOSIS — I700 Atherosclerosis of aorta: Secondary | ICD-10-CM | POA: Insufficient documentation

## 2021-11-21 DIAGNOSIS — K449 Diaphragmatic hernia without obstruction or gangrene: Secondary | ICD-10-CM | POA: Insufficient documentation

## 2021-11-21 DIAGNOSIS — R931 Abnormal findings on diagnostic imaging of heart and coronary circulation: Secondary | ICD-10-CM | POA: Insufficient documentation

## 2021-11-21 DIAGNOSIS — I2510 Atherosclerotic heart disease of native coronary artery without angina pectoris: Secondary | ICD-10-CM | POA: Diagnosis not present

## 2021-11-21 MED ORDER — NITROGLYCERIN 0.4 MG SL SUBL
0.8000 mg | SUBLINGUAL_TABLET | Freq: Once | SUBLINGUAL | Status: AC
  Administered 2021-11-21: 0.8 mg via SUBLINGUAL

## 2021-11-21 MED ORDER — NITROGLYCERIN 0.4 MG SL SUBL
SUBLINGUAL_TABLET | SUBLINGUAL | Status: AC
  Filled 2021-11-21: qty 2

## 2021-11-21 MED ORDER — IOHEXOL 350 MG/ML SOLN
100.0000 mL | Freq: Once | INTRAVENOUS | Status: AC | PRN
  Administered 2021-11-21: 100 mL via INTRAVENOUS

## 2021-11-21 NOTE — Telephone Encounter (Signed)
Patient called and notified okay to proceed with colon as scheduled. No questions at this time.

## 2021-11-22 ENCOUNTER — Ambulatory Visit (HOSPITAL_COMMUNITY)
Admission: RE | Admit: 2021-11-22 | Discharge: 2021-11-22 | Disposition: A | Discharge status: Home / Self Care | Payer: Medicare Other | Source: Ambulatory Visit | Attending: Cardiology | Admitting: Cardiology

## 2021-11-22 DIAGNOSIS — R931 Abnormal findings on diagnostic imaging of heart and coronary circulation: Secondary | ICD-10-CM | POA: Diagnosis not present

## 2021-11-28 ENCOUNTER — Encounter: Payer: Self-pay | Admitting: Internal Medicine

## 2021-11-29 ENCOUNTER — Telehealth: Payer: Self-pay | Admitting: Internal Medicine

## 2021-11-29 NOTE — Telephone Encounter (Signed)
PT is taking a med for a drug study and she needs to take this with food. She only has red jello. I told her she could take this with crackers ASAP and to follow it with lots of fluid. Pt verbalized understanding.

## 2021-11-29 NOTE — Telephone Encounter (Signed)
Inbound call from patient, stats that she is having to take some " drug study" medications and is seeking advice if she is able to have crackers with it today when she takes it. Patient is scheduled for colon tomorrow. Please advise.

## 2021-11-30 ENCOUNTER — Telehealth: Payer: Self-pay | Admitting: Gastroenterology

## 2021-11-30 ENCOUNTER — Other Ambulatory Visit: Payer: Self-pay

## 2021-11-30 ENCOUNTER — Ambulatory Visit (AMBULATORY_SURGERY_CENTER): Payer: Medicare Other | Admitting: Internal Medicine

## 2021-11-30 ENCOUNTER — Encounter: Payer: Self-pay | Admitting: Internal Medicine

## 2021-11-30 VITALS — BP 122/68 | HR 79 | Temp 97.5°F | Resp 14 | Ht 65.0 in | Wt 245.0 lb

## 2021-11-30 DIAGNOSIS — Z800 Family history of malignant neoplasm of digestive organs: Secondary | ICD-10-CM | POA: Diagnosis not present

## 2021-11-30 DIAGNOSIS — Z86010 Personal history of colonic polyps: Secondary | ICD-10-CM | POA: Diagnosis not present

## 2021-11-30 DIAGNOSIS — I10 Essential (primary) hypertension: Secondary | ICD-10-CM | POA: Diagnosis not present

## 2021-11-30 DIAGNOSIS — I2510 Atherosclerotic heart disease of native coronary artery without angina pectoris: Secondary | ICD-10-CM | POA: Diagnosis not present

## 2021-11-30 MED ORDER — SODIUM CHLORIDE 0.9 % IV SOLN: 500.0000 mL | Freq: Once | INTRAVENOUS | Status: DC

## 2021-11-30 MED ORDER — FLEET ENEMA 7-19 GM/118ML RE ENEM
1.0000 | ENEMA | Freq: Once | RECTAL | Status: AC
  Administered 2021-11-30: 1 via RECTAL

## 2021-11-30 NOTE — Telephone Encounter (Signed)
Called overnight by the patient.  She is planned for a colonoscopy at 10:30 AM with Dr. Henrene Pastor. She has been on a weight loss drug study and although she has had normal bowel movements while taking this they told her that it would slow her GI motility. The patient has not had any bowel movements as of 3:30 AM.  She was feeling full.  She was concerned that she may not be ready for colonoscopy.  The patient was offered antiemetics to be called into [facility] as a result of epic being down overnight/this morning.  She deferred on this. I told her to go ahead and initiate the second portion of her preparation at this point and make sure that she try to drink enough fluids as well. She did not have any other laxatives that she could initiate. Hopefully she will be ready but will have our team call her in the morning.  [place] charge or RN Geoffry Paradise can you please check in on the patient around 8-8 30 and see where things stand.  If she is begun to have bowel movements but is not completely clear she could still come in and do enemas in an effort to try to get herself further clear.  Patient understands and agrees with this plan of action.   Justice Britain, MD [facility] Office # [phone]

## 2021-11-30 NOTE — Progress Notes (Signed)
Pt's states no medical or surgical changes since previsit or office visit.   VS taken by CW 

## 2021-11-30 NOTE — Telephone Encounter (Signed)
Dr. Henrene Pastor,  I spoke to this pt this am- she states she is having results now- no solid stool, results are brown but she states, "it is definitely working and getting clearer every time I go to the bathroom."  She is due to arrive at 9:30 for a 10:30 procedure.  Just wanted to give you an update and make sure ok to proceed. She did the [place]env[place] prep. Thanks, [employer]

## 2021-11-30 NOTE — Patient Instructions (Signed)
Resume previous medications.    YOU HAD AN ENDOSCOPIC PROCEDURE TODAY AT [facility]:   Refer to the procedure report that was given to you for any specific questions about what was found during the examination.  If the procedure report does not answer your questions, please call your gastroenterologist to clarify.  If you requested that your care partner not be given the details of your procedure findings, then the procedure report has been included in a sealed envelope for you to review at your convenience later.  YOU SHOULD EXPECT: Some feelings of bloating in the abdomen. Passage of more gas than usual.  Walking can help get rid of the air that was put into your GI tract during the procedure and reduce the bloating. If you had a lower endoscopy (such as a colonoscopy or flexible sigmoidoscopy) you may notice spotting of blood in your stool or on the toilet paper. If you underwent a bowel prep for your procedure, you may not have a normal bowel movement for a few days.  Please Note:  You might notice some irritation and congestion in your nose or some drainage.  This is from the oxygen used during your procedure.  There is no need for concern and it should clear up in a day or so.  SYMPTOMS TO REPORT IMMEDIATELY:  Following lower endoscopy (colonoscopy or flexible sigmoidoscopy):  Excessive amounts of blood in the stool  Significant tenderness or worsening of abdominal pains  Swelling of the abdomen that is new, acute  Fever of 100F or higher   For urgent or emergent issues, a gastroenterologist can be reached at any hour by calling [phone]. Do not use MyChart messaging for urgent concerns.    DIET:  We do recommend a small meal at first, but then you may proceed to your regular diet.  Drink plenty of fluids but you should avoid alcoholic beverages for 24 hours.  ACTIVITY:  You should plan to take it easy for the rest of today and you should NOT DRIVE or use heavy  machinery until tomorrow (because of the sedation medicines used during the test).    FOLLOW UP: Our staff will call the number listed on your records 48-72 hours following your procedure to check on you and address any questions or concerns that you may have regarding the information given to you following your procedure. If we do not reach you, we will leave a message.  We will attempt to reach you two times.  During this call, we will ask if you have developed any symptoms of COVID 19. If you develop any symptoms (ie: fever, flu-like symptoms, shortness of breath, cough etc.) before then, please call [phone].  If you test positive for Covid 19 in the 2 weeks post procedure, please call and report this information to [place].    If any biopsies were taken you will be contacted by phone or by letter within the next 1-3 weeks.  Please call us at [phone] if you have not heard about the biopsies in 3 weeks.    SIGNATURES/CONFIDENTIALITY: You and/or your care partner have signed paperwork which will be entered into your electronic medical record.  These signatures attest to the fact that that the information above on your After Visit Summary has been reviewed and is understood.  Full responsibility of the confidentiality of this discharge information lies with you and/or your care-partner.

## 2021-11-30 NOTE — Progress Notes (Signed)
To PACU, VSS. Report to [email or web address] 

## 2021-11-30 NOTE — Progress Notes (Signed)
HISTORY OF PRESENT ILLNESS:  Sandra Stevens is a 71 y.o. female with a history of adenomatous colon polyps and personal history of colon cancer in her parent and grandparent.  Now for surveillance colonoscopy.  She had difficulties with her prep.  Required enemas preprocedure.  REVIEW OF SYSTEMS:  All non-GI ROS negative. Past Medical History:  Diagnosis Date   Arthritis    hx. arthritis, DDD, bursitis(hips)   Coronary artery calcification    Cystitis    past hx none recently.   GERD (gastroesophageal reflux disease)    control by med.   Hemorrhoid    HTN (hypertension)    no meds required per Dr Sandra Stevens   Hyperlipidemia    Oxygen deficiency    On oxygen at night 2L   PONV (postoperative nausea and vomiting)    nausea   Sleep apnea    Not on CPAP.  On O2   Varicose vein of leg     Past Surgical History:  Procedure Laterality Date   ANKLE SURGERY  Nov 2012   COLONOSCOPY  2010   Dr Sandra Stevens   COLONOSCOPY W/ POLYPECTOMY     done every 5 years due to Sandra Stevens colon cancer   DILATION AND CURETTAGE OF UTERUS     endometrial septum  06/11/12   Dr Sandra Stevens   EYE SURGERY     bilateral lasik eye surgery   facet injections  11/03/13   L4-5,L5-S1; Dr Sandra Stevens   Stevens SURGERY     x7   [place] Right    '96   POLYPECTOMY     TARSAL TUNNEL RELEASE     TONSILLECTOMY     TOTAL HIP ARTHROPLASTY Right 08/01/2016   Procedure: RIGHT TOTAL HIP ARTHROPLASTY ANTERIOR APPROACH;  Surgeon: Sandra Arabian, MD;  Location: [facility];  Service: Orthopedics;  Laterality: Right;   TOTAL Stevens ARTHROPLASTY  '04 / '05   x2- bilateral.   WISDOM TOOTH EXTRACTION     WRIST SURGERY     x2    Social History Sandra Stevens  reports that she quit smoking about 23 years ago. Her smoking use included cigarettes. She has a 10.00 pack-year smoking history. She has never used smokeless tobacco. She reports current alcohol use. She reports that she does not use drugs.  family history includes  Colon cancer in her cousin, father, and maternal grandmother; Hypertension in an other family member.  No Known Allergies     PHYSICAL EXAMINATION:  Vital signs: BP 131/69    Pulse 91    Temp (!) 97.5 F (36.4 C)    Resp 14    Ht 5\' 5"  (1.651 m)    Wt 245 lb (111.1 kg)    SpO2 99%    BMI 40.77 kg/m  General: Well-developed, well-nourished, no acute distress HEENT: Sclerae are anicteric, conjunctiva pink. Oral mucosa intact Lungs: Clear Heart: Regular Abdomen: soft, nontender, nondistended, no obvious ascites, no peritoneal signs, normal bowel sounds. No organomegaly. Extremities: No edema Psychiatric: alert and oriented x3. Cooperative     ASSESSMENT:  Family history of colon cancer and personal history of adenomatous colon polyps.  Now for surveillance   PLAN:  Surveillance colonoscopy

## 2021-11-30 NOTE — Op Note (Signed)
[facility] Patient Name: Sandra Stevens Procedure Date: 11/30/2021 10:33 AM MRN: [id number] Endoscopist: Docia Chuck. Henrene Pastor , MD Age: 71 Referring MD:  Date of Birth: 17-Feb-1951 Gender: Female Account #: [id number] Procedure:                Colonoscopy Indications:              High risk colon cancer surveillance: Personal                            history of non-advanced adenoma. Also family                            history of colon cancer in parent and grandparent.                            Previous examinations 2010, 2015 Medicines:                Monitored Anesthesia Care Procedure:                Pre-Anesthesia Assessment:                           - Prior to the procedure, a History and Physical                            was performed, and patient medications and                            allergies were reviewed. The patient's tolerance of                            previous anesthesia was also reviewed. The risks                            and benefits of the procedure and the sedation                            options and risks were discussed with the patient.                            All questions were answered, and informed consent                            was obtained. Prior Anticoagulants: The patient has                            taken no previous anticoagulant or antiplatelet                            agents. ASA Grade Assessment: II - A patient with                            mild systemic disease. After reviewing the risks  and benefits, the patient was deemed in                            satisfactory condition to undergo the procedure.                           After obtaining informed consent, the colonoscope                            was passed under direct vision. Throughout the                            procedure, the patient's blood pressure, pulse, and                            oxygen saturations were monitored  continuously. The                            Olympus CF-HQ190L ([id number]) Colonoscope was                            introduced through the anus and advanced to the the                            cecum, identified by appendiceal orifice and                            ileocecal valve. The ileocecal valve, appendiceal                            orifice, and rectum were photographed. The quality                            of the bowel preparation was good. The colonoscopy                            was performed without difficulty. The patient                            tolerated the procedure well. The bowel preparation                            used was Plenvu via split dose instruction.                            Preprocedure enemas were required. Scope In: 11:54:37 AM Scope Out: 12:09:32 PM Scope Withdrawal Time: 0 hours 11 minutes 42 seconds  Total Procedure Duration: 0 hours 14 minutes 55 seconds  Findings:                 The entire examined colon appeared normal on direct                            and retroflexion views. Complications:  No immediate complications. Estimated blood loss:                            None. Estimated Blood Loss:     Estimated blood loss: none. Impression:               - The entire examined colon is normal on direct and                            retroflexion views.                           - No specimens collected. Recommendation:           - Repeat colonoscopy in 5 years for surveillance                            (personal and family history).                           - Patient has a contact number available for                            emergencies. The signs and symptoms of potential                            delayed complications were discussed with the                            patient. Return to normal activities tomorrow.                            Written discharge instructions were provided to the                             patient.                           - Resume previous diet.                           - Continue present medications. Docia Chuck. Henrene Pastor, MD 11/30/2021 12:16:32 PM This report has been signed electronically.

## 2021-12-04 ENCOUNTER — Telehealth: Payer: Self-pay | Admitting: *Deleted

## 2021-12-04 NOTE — Telephone Encounter (Signed)
°  Follow up Call-  Call back number 11/30/2021  Post procedure Call Back phone  # [phone]  Permission to leave phone message Yes  Some recent data might be hidden     Patient questions: Message left to call us if necessary.

## 2021-12-04 NOTE — Telephone Encounter (Signed)
°  Follow up Call-  Call back number 11/30/2021  Post procedure Call Back phone  # [phone]  Permission to leave phone message Yes  Some recent data might be hidden     Patient questions: Message left to call if necessary.

## 2021-12-08 ENCOUNTER — Other Ambulatory Visit: Payer: Self-pay

## 2021-12-08 ENCOUNTER — Ambulatory Visit (INDEPENDENT_AMBULATORY_CARE_PROVIDER_SITE_OTHER): Payer: Medicare Other

## 2021-12-08 ENCOUNTER — Encounter: Payer: Self-pay | Admitting: Family Medicine

## 2021-12-08 ENCOUNTER — Ambulatory Visit: Payer: Medicare Other | Admitting: Family Medicine

## 2021-12-08 VITALS — BP 128/82 | HR 70 | Ht 65.0 in | Wt 247.2 lb

## 2021-12-08 DIAGNOSIS — S82891D Other fracture of right lower leg, subsequent encounter for closed fracture with routine healing: Secondary | ICD-10-CM

## 2021-12-08 DIAGNOSIS — R600 Localized edema: Secondary | ICD-10-CM | POA: Diagnosis not present

## 2021-12-08 NOTE — Progress Notes (Signed)
° °  I, Peterson Lombard, LAT, ATC acting as a scribe for Lynne Leader, MD.  Sandra Stevens is a 71 y.o. female who presents to [place] at [facility] today for f/u fx of the R fibula that she injured on 12/18. Pt was last seen by Dr. Georgina Snell on 11/17/21 and was advised to minimize weight bearing since pt was completely intolerant of cam walker boot and nonweightbearing status. Today, pt reports R ankle feels slightly better. Pt has been doing her best to be non-weight bearing and is using her cane.  Dx imaging: 10/26/21 R knee & R ankle XR             10/12/21 R ankle XR  Pertinent review of systems: No fevers or chills  Relevant historical information: History of right total knee replacement.   Exam:  BP 128/82    Pulse 70    Ht 5\' 5"  (1.651 m)    Wt 247 lb 3.2 oz (112.1 kg)    SpO2 98%    BMI 41.14 kg/m  General: Well Developed, well nourished, and in no acute distress.   MSK: Right ankle normal-appearing Decreased range of motion. Mildly tender palpation at lateral malleolus. Pulses cap refill and sensation are intact distally.    Lab and Radiology Results  X-ray images right ankle obtained today personally and independently interpreted Oblique distal fibular fracture minimally displaced. Callus formation is present without any change in angulation or displacement. Await formal radiology review     Assessment and Plan: 71 y.o. female with right ankle fracture.  Injury occurred similar 18th.  Clinically she is improving despite an adequate immobilization due to her life and body circumstances. X-ray also shows good signs of healing.  Plan to continue current regimen with reduced weightbearing.  Recheck in 1 month.   PDMP not reviewed this encounter. Orders Placed This Encounter  Procedures   DG Ankle Complete Right    Standing Status:   Future    Number of Occurrences:   1    Standing Expiration Date:   12/08/2022    Order Specific Question:   Reason  for Exam (SYMPTOM  OR DIAGNOSIS REQUIRED)    Answer:   right ankle fracture    Order Specific Question:   Preferred imaging location?    Answer:   [facility]   No orders of the defined types were placed in this encounter.    Discussed warning signs or symptoms. Please see discharge instructions. Patient expresses understanding.   The above documentation has been reviewed and is accurate and complete Lynne Leader, M.D.

## 2021-12-08 NOTE — Patient Instructions (Signed)
Thank you for coming in today.   Recheck in 4 weeks.   Let me know if you have a problem.

## 2021-12-11 NOTE — Progress Notes (Signed)
Right ankle x-ray shows that the fracture is healing.

## 2022-01-08 ENCOUNTER — Encounter: Payer: Self-pay | Admitting: Family Medicine

## 2022-01-08 ENCOUNTER — Other Ambulatory Visit: Payer: Self-pay

## 2022-01-08 ENCOUNTER — Ambulatory Visit (INDEPENDENT_AMBULATORY_CARE_PROVIDER_SITE_OTHER): Payer: Medicare Other

## 2022-01-08 ENCOUNTER — Ambulatory Visit: Payer: Medicare Other | Admitting: Family Medicine

## 2022-01-08 VITALS — BP 100/70 | HR 70 | Ht 65.0 in | Wt 245.2 lb

## 2022-01-08 DIAGNOSIS — S82891D Other fracture of right lower leg, subsequent encounter for closed fracture with routine healing: Secondary | ICD-10-CM

## 2022-01-08 DIAGNOSIS — E785 Hyperlipidemia, unspecified: Secondary | ICD-10-CM | POA: Insufficient documentation

## 2022-01-08 DIAGNOSIS — S8291XA Unspecified fracture of right lower leg, initial encounter for closed fracture: Secondary | ICD-10-CM | POA: Diagnosis not present

## 2022-01-08 NOTE — Progress Notes (Signed)
?  ? ?Cardiology Office Note ? ? ?Date:  01/09/2022  ? ?ID:  Sandra Stevens, DOB October 09, 1951, MRN [id number] ? ?PCP:  Deland Pretty, MD  ?Cardiologist:   Minus Breeding, MD ?Referring:  Deland Pretty, MD ? ?Chief Complaint  ?Patient presents with  ? Atrial Fibrillation  ? ? ?  ?History of Present Illness: ?Sandra Stevens is a 71 y.o. female who is referred for evaluation of elevated coronary calcium.  The score was 1,927 that is 99th percentile.    She had a cardiac CTA.  She was found to have LAD 50 - 69% stenosis that was not clinically significant via FFR.  RCA was 25 - 49%.   She unfortunately is still recovering from an ankle injury and is unable to walk on it yet.  With the activity that she can do she denies any cardiovascular symptoms.The patient denies any new symptoms such as chest discomfort, neck or arm discomfort. There has been no new shortness of breath, PND or orthopnea. There have been no reported palpitations, presyncope or syncope.  ? ?Of note she says she is in a clinical trial and it sounds like it is a GLP-1 receptor agonist.  She is lost weight.  I do not have the details on this as its not done through our research foundation. ? ?Past Medical History:  ?Diagnosis Date  ? Arthritis   ? hx. arthritis, DDD, bursitis(hips)  ? CAD (coronary artery disease)   ? Cystitis   ? past hx none recently.  ? GERD (gastroesophageal reflux disease)   ? control by med.  ? Hemorrhoid   ? HTN (hypertension)   ? no meds required per Dr Linna Darner  ? Hyperlipidemia   ? Oxygen deficiency   ? On oxygen at night 2L  ? PONV (postoperative nausea and vomiting)   ? nausea  ? Sleep apnea   ? Not on CPAP.  On O2  ? Varicose vein of leg   ? ? ?Past Surgical History:  ?Procedure Laterality Date  ? ANKLE SURGERY  Nov 2012  ? COLONOSCOPY  2010  ? Dr Henrene Pastor  ? COLONOSCOPY W/ POLYPECTOMY    ? done every 5 years due to [place] colon cancer  ? DILATION AND CURETTAGE OF UTERUS    ? endometrial septum  06/11/12  ? Dr Radene Knee  ? EYE  SURGERY    ? bilateral lasik eye surgery  ? facet injections  11/03/13  ? L4-5,L5-S1; Dr Nelva Bush  ? KNEE SURGERY    ? x7  ? PAROTID GLAND TUMOR EXCISION Right   ? '96  ? POLYPECTOMY    ? TARSAL TUNNEL RELEASE    ? TONSILLECTOMY    ? TOTAL HIP ARTHROPLASTY Right 08/01/2016  ? Procedure: RIGHT TOTAL HIP ARTHROPLASTY ANTERIOR APPROACH;  Surgeon: Gaynelle Arabian, MD;  Location: [facility];  Service: Orthopedics;  Laterality: Right;  ? TOTAL KNEE ARTHROPLASTY  '04 / '05  ? x2- bilateral.  ? WISDOM TOOTH EXTRACTION    ? WRIST SURGERY    ? x2  ? ? ? ?Current Outpatient Medications  ?Medication Sig Dispense Refill  ? amitriptyline (ELAVIL) 25 MG tablet Take 25 mg by mouth at bedtime.    ? aspirin 81 MG chewable tablet Chew 81 mg by mouth daily.    ? cholecalciferol (VITAMIN D) 25 MCG (1000 UNIT) tablet Take 25 mcg by mouth every other day.    ? gabapentin (NEURONTIN) 400 MG capsule Take 1 capsule (400 mg total) by mouth 3 (  three) times daily. (Patient taking differently: Take 400 mg by mouth at bedtime.) 270 capsule 2  ? irbesartan-hydrochlorothiazide (AVALIDE) 300-12.5 MG tablet Take 1 tablet by mouth daily.    ? pantoprazole (PROTONIX) 40 MG tablet Take 40 mg by mouth daily.    ? rosuvastatin (CRESTOR) 20 MG tablet Take 20 mg by mouth daily.    ? zaleplon (SONATA) 10 MG capsule Take 10 mg by mouth as needed.  0  ? ?No current facility-administered medications for this visit.  ? ? ?Allergies:   Patient has no known allergies.  ? ?ROS:  Please see the history of present illness.   Otherwise, review of systems are positive for none.   All other systems are reviewed and negative.  ? ? ?PHYSICAL EXAM: ?VS:  BP 112/74   Pulse 74   Ht '5\' 5"'$  (1.651 m)   Wt 246 lb (111.6 kg)   BMI 40.94 kg/m?  , BMI Body mass index is 40.94 kg/m?. ?GENERAL:  Well appearing ?NECK:  No jugular venous distention, waveform within normal limits, carotid upstroke brisk and symmetric, no bruits, no thyromegaly ?LUNGS:  Clear to auscultation  bilaterally ?CHEST:  Unremarkable ?HEART:  PMI not displaced or sustained,S1 and S2 within normal limits, no S3, no S4, no clicks, no rubs, no murmurs ?ABD:  Flat, positive bowel sounds normal in frequency in pitch, no bruits, no rebound, no guarding, no midline pulsatile mass, no hepatomegaly, no splenomegaly ?EXT:  2 plus pulses throughout, no edema, no cyanosis no clubbing ? ? ?EKG:  EKG is not ordered today. ?NA ? ? ?Recent Labs: ?No results found for requested labs within last 8760 hours.  ? ? ?Lipid Panel ?   ?Component Value Date/Time  ? CHOL 160 08/04/2012 0949  ? TRIG 74.0 08/04/2012 0949  ? HDL 49.00 08/04/2012 0949  ? CHOLHDL 3 08/04/2012 0949  ? VLDL 14.8 08/04/2012 0949  ? [place] 96 08/04/2012 0949  ? ?  ? ?Wt Readings from Last 3 Encounters:  ?01/09/22 246 lb (111.6 kg)  ?01/08/22 245 lb 3.2 oz (111.2 kg)  ?12/08/21 247 lb 3.2 oz (112.1 kg)  ?  ? ? ?Other studies Reviewed: ?Additional studies/ records that were reviewed today include: None. ?Review of the above records demonstrates: NA ? ? ?ASSESSMENT AND PLAN: ? ?CAD: The patient has no new sypmtoms.  No further cardiovascular testing is indicated.  We will continue with aggressive risk reduction and meds as listed.we are participating in aggressive risk reduction. ? ?HTN: Blood pressure is controlled.  No change in therapy. ? ?DYSLIPIDEMIA:   LDL was excellent by her report although I do not have the most recent readings.  She was started on Crestor and said she had an excellent response and this is followed by Deland Pretty, MD ? ? ?Current medicines are reviewed at length with the patient today.  The patient does not have concerns regarding medicines. ? ?The following changes have been made:  no change ? ?Labs/ tests ordered today include:  None ? ?No orders of the defined types were placed in this encounter. ? ? ? ?Disposition:   FU with me in one year.  ? ? ?Signed, ?Minus Breeding, MD  ?01/09/2022 3:12 PM    ?[place] ? ? ? ?

## 2022-01-08 NOTE — Patient Instructions (Addendum)
Good to see you today. ? ?Follow-up: 2 months ?

## 2022-01-08 NOTE — Progress Notes (Signed)
   I, Wendy Poet, LAT, ATC, am serving as scribe for Dr. Lynne Leader.  Sandra Stevens is a 71 y.o. female who presents to [place] at [facility] today for f/u fx of the R fibula that she injured on 12/18. Pt was last seen by Dr. Georgina Snell on 12/08/21 and was advised to cont current regimen w/ reduced weightbearing. Today, pt reports that her ankle con't to improve, rating her current pain level at a 1/10.  Dx imaging: 12/08/21 R ankle XR  11/17/21 R ankle XR 10/26/21 R knee & R ankle XR             10/12/21 R ankle XR  Pertinent review of systems: No fevers or chills  Relevant historical information: Hypertension   Exam:  BP 100/70 (BP Location: Left Arm, Patient Position: Sitting, Cuff Size: Large)   Pulse 70   Ht '5\' 5"'$  (1.651 m)   Wt 245 lb 3.2 oz (111.2 kg)   SpO2 95%   BMI 40.80 kg/m  General: Well Developed, well nourished, and in no acute distress.   MSK: Right ankle minimal tenderness at distal fibula    Lab and Radiology Results  X-ray images right ankle obtained today personally independently interpreted. Good callus formation developing at oblique fracture of the distal fibula however there still is some lucency remaining at fracture. Await formal radiology review    Assessment and Plan: 71 y.o. female with right ankle fracture.  This has been ongoing for 3 months now.  She slowly healing on x-ray and feeling much better now clinically.  We discussed that he have a bone stimulator which she is not interested in.  I think for now she can advance activity as tolerated and try to use an ankle brace if she can get one that fits well. Recheck back with me in 2 months.   PDMP not reviewed this encounter. Orders Placed This Encounter  Procedures   DG Ankle Complete Right    Standing Status:   Future    Number of Occurrences:   1    Standing Expiration Date:   01/09/2023    Order Specific Question:   Reason for Exam (SYMPTOM  OR DIAGNOSIS REQUIRED)     Answer:   right ankle fracture    Order Specific Question:   Preferred imaging location?    Answer:   [facility]   No orders of the defined types were placed in this encounter.    Discussed warning signs or symptoms. Please see discharge instructions. Patient expresses understanding.   The above documentation has been reviewed and is accurate and complete Lynne Leader, M.D.

## 2022-01-09 ENCOUNTER — Ambulatory Visit: Payer: Medicare Other | Admitting: Cardiology

## 2022-01-09 ENCOUNTER — Encounter: Payer: Self-pay | Admitting: Cardiology

## 2022-01-09 VITALS — BP 112/74 | HR 74 | Ht 65.0 in | Wt 246.0 lb

## 2022-01-09 DIAGNOSIS — E785 Hyperlipidemia, unspecified: Secondary | ICD-10-CM

## 2022-01-09 DIAGNOSIS — I2510 Atherosclerotic heart disease of native coronary artery without angina pectoris: Secondary | ICD-10-CM | POA: Diagnosis not present

## 2022-01-09 DIAGNOSIS — I10 Essential (primary) hypertension: Secondary | ICD-10-CM | POA: Diagnosis not present

## 2022-01-09 NOTE — Patient Instructions (Signed)
Medication Instructions:  ?No changes ?*If you need a refill on your cardiac medications before your next appointment, please call your pharmacy* ? ? ?Lab Work: ?None ordered ?If you have labs (blood work) drawn today and your tests are completely normal, you will receive your results only by: ?MyChart Message (if you have MyChart) OR ?A paper copy in the mail ?If you have any lab test that is abnormal or we need to change your treatment, we will call you to review the results. ? ? ?Testing/Procedures: ?None ordered ? ? ?Follow-Up: ?At [facility], you and your health needs are our priority.  As part of our continuing mission to provide you with exceptional heart care, we have created designated Provider Care Teams.  These Care Teams include your primary Cardiologist (physician) and Advanced Practice Providers (APPs -  Physician Assistants and Nurse Practitioners) who all work together to provide you with the care you need, when you need it. ? ?We recommend signing up for the patient portal called "MyChart".  Sign up information is provided on this After Visit Summary.  MyChart is used to connect with patients for Virtual Visits (Telemedicine).  Patients are able to view lab/test results, encounter notes, upcoming appointments, etc.  Non-urgent messages can be sent to your provider as well.   ?To learn more about what you can do with MyChart, go to [email or web address].   ? ?Your next appointment:   ?12 month(s) ? ?The format for your next appointment:   ?In Person ? ?Provider:   ?James Hochrein, MD { ? ? ?

## 2022-01-11 NOTE — Progress Notes (Signed)
Fracture is still healing but not fully healed

## 2022-03-08 ENCOUNTER — Ambulatory Visit (INDEPENDENT_AMBULATORY_CARE_PROVIDER_SITE_OTHER): Payer: Medicare Other

## 2022-03-08 ENCOUNTER — Ambulatory Visit: Payer: Medicare Other | Admitting: Family Medicine

## 2022-03-08 ENCOUNTER — Encounter: Payer: Self-pay | Admitting: Family Medicine

## 2022-03-08 VITALS — BP 98/64 | HR 71 | Ht 65.0 in | Wt 236.8 lb

## 2022-03-08 DIAGNOSIS — S82891K Other fracture of right lower leg, subsequent encounter for closed fracture with nonunion: Secondary | ICD-10-CM | POA: Diagnosis not present

## 2022-03-08 DIAGNOSIS — M25571 Pain in right ankle and joints of right foot: Secondary | ICD-10-CM | POA: Diagnosis not present

## 2022-03-08 DIAGNOSIS — S82891D Other fracture of right lower leg, subsequent encounter for closed fracture with routine healing: Secondary | ICD-10-CM

## 2022-03-08 MED ORDER — AMBULATORY NON FORMULARY MEDICATION: 0 refills | Status: DC

## 2022-03-08 NOTE — Patient Instructions (Addendum)
Good to see you today.  Follow-up: 1 month.   We will work to getting a bone stimulator.   Please go to [facility] supply to get the ankle brace we talked about today. You may also be able to get it from [employer].

## 2022-03-08 NOTE — Progress Notes (Signed)
   I, Wendy Poet, LAT, ATC, am serving as scribe for Dr. Lynne Leader.  Sandra Stevens is a 71 y.o. female who presents to [place] at [facility] today for f/u fx of the R fibula that she injured on 12/18. Pt was last seen by Dr. Georgina Snell on 01/08/22 and was advised to advance activity as tolerated and to try to use an ankle brace if she can find one that would fit. Today, pt reports that she has started to have some pain in the lower 1/3 of her R fibula for the first time since Christmas.  She states her pain is intermittent in nature and hurts when she's walking.  Dx imaging: 01/08/22 R ankle XR 12/08/21 R ankle XR             11/17/21 R ankle XR 10/26/21 R knee & R ankle XR             10/12/21 R ankle XR  Pertinent review of systems: No fevers or chills  Relevant historical information: Hypertension   Exam:  BP 98/64 (BP Location: Right Arm, Patient Position: Sitting, Cuff Size: Large)   Pulse 71   Ht '5\' 5"'$  (1.651 m)   Wt 236 lb 12.8 oz (107.4 kg)   SpO2 95%   BMI 39.41 kg/m  General: Well Developed, well nourished, and in no acute distress.   MSK: Right ankle: Normal. Tender palpation lateral ankle and distal fibula. Normal ankle motion.  Stable ligamentous exam.    Lab and Radiology Results  X-ray images right ankle obtained today personally and independently interpreted. Callus formation present at proximal end of the oblique fracture at the fibula however the distal end of the fracture is no callus formation and remains open with a nonunion. No significant change in appearance of the last 2 months compared to last x-ray early March 2023. Await formal radiology review     Assessment and Plan: 71 y.o. female with persistent fracture right ankle.  This is now 56 months old. Unfortunately now she is having pain which is a new finding for her.  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  We have tried immobilization which caused a  lot of knee pain and she really struggled with nonweightbearing.  At this point we discussed options we will plan for bone stimulator which should help and be a reasonable compromise.  Also recommend good ankle support ankle brace at medical supply company.  Recheck in 1 month.   PDMP not reviewed this encounter. Orders Placed This Encounter  Procedures   DG Ankle Complete Right    Standing Status:   Future    Number of Occurrences:   1    Standing Expiration Date:   04/08/2022    Order Specific Question:   Reason for Exam (SYMPTOM  OR DIAGNOSIS REQUIRED)    Answer:   R ankle pain    Order Specific Question:   Preferred imaging location?    Answer:   [facility]   No orders of the defined types were placed in this encounter.    Discussed warning signs or symptoms. Please see discharge instructions. Patient expresses understanding.   The above documentation has been reviewed and is accurate and complete Lynne Leader, M.D.

## 2022-03-09 DIAGNOSIS — H2513 Age-related nuclear cataract, bilateral: Secondary | ICD-10-CM | POA: Diagnosis not present

## 2022-03-09 DIAGNOSIS — H40013 Open angle with borderline findings, low risk, bilateral: Secondary | ICD-10-CM | POA: Diagnosis not present

## 2022-03-09 DIAGNOSIS — H17823 Peripheral opacity of cornea, bilateral: Secondary | ICD-10-CM | POA: Diagnosis not present

## 2022-03-12 NOTE — Progress Notes (Signed)
Right ankle fracture is healing but still not healed.

## 2022-03-15 DIAGNOSIS — R7989 Other specified abnormal findings of blood chemistry: Secondary | ICD-10-CM | POA: Diagnosis not present

## 2022-03-16 DIAGNOSIS — M67441 Ganglion, right hand: Secondary | ICD-10-CM | POA: Diagnosis not present

## 2022-03-16 DIAGNOSIS — I10 Essential (primary) hypertension: Secondary | ICD-10-CM | POA: Diagnosis not present

## 2022-03-16 DIAGNOSIS — R748 Abnormal levels of other serum enzymes: Secondary | ICD-10-CM | POA: Diagnosis not present

## 2022-03-22 DIAGNOSIS — S82891K Other fracture of right lower leg, subsequent encounter for closed fracture with nonunion: Secondary | ICD-10-CM | POA: Diagnosis not present

## 2022-04-03 DIAGNOSIS — R233 Spontaneous ecchymoses: Secondary | ICD-10-CM | POA: Diagnosis not present

## 2022-04-03 DIAGNOSIS — M79674 Pain in right toe(s): Secondary | ICD-10-CM | POA: Diagnosis not present

## 2022-04-04 ENCOUNTER — Ambulatory Visit (INDEPENDENT_AMBULATORY_CARE_PROVIDER_SITE_OTHER): Payer: Medicare Other

## 2022-04-04 ENCOUNTER — Ambulatory Visit: Payer: Medicare Other | Admitting: Family Medicine

## 2022-04-04 VITALS — BP 152/82 | HR 63 | Wt 240.8 lb

## 2022-04-04 DIAGNOSIS — M25571 Pain in right ankle and joints of right foot: Secondary | ICD-10-CM | POA: Diagnosis not present

## 2022-04-04 DIAGNOSIS — R600 Localized edema: Secondary | ICD-10-CM | POA: Diagnosis not present

## 2022-04-04 DIAGNOSIS — S82891D Other fracture of right lower leg, subsequent encounter for closed fracture with routine healing: Secondary | ICD-10-CM

## 2022-04-04 DIAGNOSIS — M109 Gout, unspecified: Secondary | ICD-10-CM | POA: Diagnosis not present

## 2022-04-04 LAB — URIC ACID: Uric Acid, Serum: 5.7 mg/dL (ref 2.4–7.0)

## 2022-04-04 NOTE — Progress Notes (Addendum)
I, Peterson Lombard, LAT, ATC acting as a scribe for Lynne Leader, MD.  Sandra Stevens is a 71 y.o. female who presents to [place] at [facility] today for f/u fx of the R fibula that she injured on 12/18. Pt was last seen by Dr. Georgina Snell on 03/08/22 and was advised to plan for bone stimulator and to wear a good ankle support brace. Today, pt reports she is currently being treated for gout in her R foot ongoing for 3 days and had an IM injection and started on prednisone this morning. Pt is not having any ankle pain, only in the Great toe from the gout flare. Pt starting using the bone stimulator x 9 days.   She previously was involved in a drug study for a small molecule GLP1 analog but had to discontinue the medication due to transaminitis.  She recently did have some labs CMP etc but not Uric acid.  Dr Mateo Flow at [place] is [phone].  Dx imaging: 03/08/22 R ankle XR 01/08/22 R ankle XR 12/08/21 R ankle XR             11/17/21 R ankle XR 10/26/21 R knee & R ankle XR             10/12/21 R ankle XR  Pertinent review of systems: No fevers or chills  Relevant historical information: Hypertension and coronary artery disease.   Exam:  BP (!) 152/82   Pulse 63   Wt 240 lb 12.8 oz (109.2 kg)   SpO2 96%   BMI 40.07 kg/m  General: Well Developed, well nourished, and in no acute distress.   MSK: Right ankle: Tender palpation mildly distal fibula. Great toe: Erythematous and tender at MTP.  Decreased motion.    Lab and Radiology Results  X-ray images right ankle obtained today personally and independently interpreted. Healing fracture at distal fibula with some callus formation present.  Incomplete bridging still present. Await formal radiology review  Addendum: Received lab report liver enzymes are now in the normal range.  Creatinine 1.12.  Amylase and lipase are normal/not elevated.  Lab report will be sent to scan.  Assessment and Plan: 71 y.o. female with  right ankle fracture still healing but improving.  Patient just got her bone stimulator about a week ago.  Encouraged her to use the bone stimulator regularly and check back in 1 month.  Gout great toe right foot.  Fundamentally we need to check uric acid and metabolic panel.  She just had labs yesterday as part of her participation in a drug study.  I have reached out to Dr. Mateo Flow.  She did not have uric acid as part of her labs.  Plan to check uric acid next week.  Dr. Mateo Flow will fax me the labs that she had done yesterday.  We will add or adjust Medication based on results of labs.   PDMP not reviewed this encounter. Orders Placed This Encounter  Procedures   DG Ankle Complete Right    Standing Status:   Future    Number of Occurrences:   1    Standing Expiration Date:   04/05/2023    Order Specific Question:   Reason for Exam (SYMPTOM  OR DIAGNOSIS REQUIRED)    Answer:   right ankle pain    Order Specific Question:   Preferred imaging location?    Answer:   [facility]   Uric acid    Standing Status:   Future  Standing Expiration Date:   04/05/2023   No orders of the defined types were placed in this encounter.    Discussed warning signs or symptoms. Please see discharge instructions. Patient expresses understanding.   The above documentation has been reviewed and is accurate and complete Lynne Leader, M.D.

## 2022-04-04 NOTE — Patient Instructions (Addendum)
Thank you for coming in today.  I will investigate your gout plan.  Expect to hear from me.   Try to get your recent labs to me.   Otherwise plan on 1 month.

## 2022-04-05 NOTE — Progress Notes (Signed)
Uric acid is in the normal range.

## 2022-04-06 NOTE — Progress Notes (Signed)
Fracture is healing.

## 2022-05-07 NOTE — Progress Notes (Unsigned)
   I, Wendy Poet, LAT, ATC, am serving as scribe for Dr. Lynne Leader.  Shyloh Derosa is a 71 y.o. female who presents to [place] at [facility] today for f/u of R fibular fx that she injured on 10/08/21 and a R great toe pain thought to be due to gout flare. Pt was last seen by Dr. Georgina Snell on 04/04/22 and was encouraged her to use the bone stimulator regularly and prior labs were obtained from Dr. Mateo Flow and her uric acid was checked. Today, pt reports that her R great toe is feeling fine.  She states that her R great toe pain and swelling resolved after taking the prednisone.  She also notes that her R ankle is feeling fine and her foot is not swollen.  However, if she stands for a prolonged period of time or if she walks for a prolonged period of time, then she will have increased pain.  Dx imaging: 04/04/22 Labs- uric acid  04/04/22 R ankle XR 03/08/22 R ankle XR 01/08/22 R ankle XR 12/08/21 R ankle XR             11/17/21 R ankle XR 10/26/21 R knee & R ankle XR             10/12/21 R ankle XR  Pertinent review of systems: no fever or chills  Relevant historical information: HTN   Exam:  BP 138/78 (BP Location: Left Arm, Patient Position: Sitting, Cuff Size: Large)   Pulse 67   Ht '5\' 5"'$  (1.651 m)   Wt 248 lb 9.6 oz (112.8 kg)   SpO2 95%   BMI 41.37 kg/m  General: Well Developed, well nourished, and in no acute distress.   MSK: Right ankle: Normal appearing Normal motion.  Normal gait.    Lab and Radiology Results  X-ray images right ankle obtained today personally and independently interpreted. Progressive healing distal fibular fracture. Await formal radiology review   Assessment and Plan: 71 y.o. female with right ankle distal fibular fracture.  Slow healing with a bone stimulator.  Await formal radiology review of x-ray.  Recheck in 3 months.  Presumed gout at the last visit: Uric acid was well controlled.  Watchful waiting for now.   PDMP not  reviewed this encounter. Orders Placed This Encounter  Procedures   DG Ankle Complete Right    Standing Status:   Future    Number of Occurrences:   1    Standing Expiration Date:   05/09/2023    Order Specific Question:   Reason for Exam (SYMPTOM  OR DIAGNOSIS REQUIRED)    Answer:   right ankle fx    Order Specific Question:   Preferred imaging location?    Answer:   [facility]   No orders of the defined types were placed in this encounter.    Discussed warning signs or symptoms. Please see discharge instructions. Patient expresses understanding.   The above documentation has been reviewed and is accurate and complete Lynne Leader, M.D.

## 2022-05-08 ENCOUNTER — Ambulatory Visit (INDEPENDENT_AMBULATORY_CARE_PROVIDER_SITE_OTHER): Payer: Medicare Other

## 2022-05-08 ENCOUNTER — Encounter: Payer: Self-pay | Admitting: Family Medicine

## 2022-05-08 ENCOUNTER — Ambulatory Visit (INDEPENDENT_AMBULATORY_CARE_PROVIDER_SITE_OTHER): Payer: Medicare Other | Admitting: Family Medicine

## 2022-05-08 VITALS — BP 138/78 | HR 67 | Ht 65.0 in | Wt 248.6 lb

## 2022-05-08 DIAGNOSIS — S82891D Other fracture of right lower leg, subsequent encounter for closed fracture with routine healing: Secondary | ICD-10-CM

## 2022-05-08 DIAGNOSIS — S82831A Other fracture of upper and lower end of right fibula, initial encounter for closed fracture: Secondary | ICD-10-CM | POA: Diagnosis not present

## 2022-05-08 NOTE — Patient Instructions (Addendum)
Good to see you today.  Please get an Xray today before you leave   Continue using the bone stimulator  Follow-up: 3 month

## 2022-05-09 ENCOUNTER — Other Ambulatory Visit: Payer: Self-pay | Admitting: Internal Medicine

## 2022-05-09 DIAGNOSIS — Z1231 Encounter for screening mammogram for malignant neoplasm of breast: Secondary | ICD-10-CM

## 2022-05-09 NOTE — Progress Notes (Signed)
Fracture is slowly healing.  This is great news.  Continue the bone stimulator.

## 2022-05-23 DIAGNOSIS — I10 Essential (primary) hypertension: Secondary | ICD-10-CM | POA: Diagnosis not present

## 2022-05-24 DIAGNOSIS — M720 Palmar fascial fibromatosis [Dupuytren]: Secondary | ICD-10-CM | POA: Diagnosis not present

## 2022-05-24 DIAGNOSIS — M1811 Unilateral primary osteoarthritis of first carpometacarpal joint, right hand: Secondary | ICD-10-CM | POA: Diagnosis not present

## 2022-05-31 ENCOUNTER — Telehealth: Payer: Self-pay

## 2022-05-31 NOTE — Telephone Encounter (Signed)
   Name: Sandra Stevens  DOB: 1951-05-04  MRN: [id number]  Primary Cardiologist: Minus Breeding, MD  Last visit 12/2021.  Preoperative team, please contact this patient and set up a phone call appointment for further preoperative risk assessment. Please obtain consent and complete medication review. Thank you for your help.  I confirm that guidance regarding antiplatelet and oral anticoagulation therapy has been completed and, if necessary, noted below. H/o moderate CAD -> no prior PCI. If appropriate can either be recommended to continue or hold if no new cardiac progression.   Charlie Pitter, PA-C 05/31/2022, 1:27 PM [place]

## 2022-05-31 NOTE — Telephone Encounter (Signed)
   Pre-operative Risk Assessment    Patient Name: Sandra Stevens  DOB: 25-Dec-1950 MRN: [id number]      Request for Surgical Clearance    Procedure:   Right small finger subtotal palmer fasciectomy with neuroplasty and repair reconstruction   Date of Surgery:  Clearance 07/03/22                                 Surgeon:  Dr. Roseanne Kaufman  Surgeon's Group or Practice Name:  [facility] Phone number:  [phone] Fax number:  [phone]   Type of Clearance Requested:   - Medical  - Pharmacy:  Hold Aspirin     Type of Anesthesia:   Block with IV sedation    Additional requests/questions:    SignedJacqulynn Cadet   05/31/2022, 12:57 PM

## 2022-06-01 ENCOUNTER — Telehealth: Payer: Self-pay | Admitting: *Deleted

## 2022-06-01 NOTE — Telephone Encounter (Signed)
Pt agreeable to plan of care for tele pre op appt 06/21/22 @ 3 pm. Meds not done as pt was out to lunch with friends. Consent has been given.     Patient Consent for Virtual Visit        Sandra Stevens has provided verbal consent on 06/01/2022 for a virtual visit (video or telephone).   CONSENT FOR VIRTUAL VISIT FOR:  Sandra Stevens  By participating in this virtual visit I agree to the following:  I hereby voluntarily request, consent and authorize [facility] and its employed or contracted physicians, physician assistants, nurse practitioners or other licensed health care professionals (the Practitioner), to provide me with telemedicine health care services (the "Services") as deemed necessary by the treating Practitioner. I acknowledge and consent to receive the Services by the Practitioner via telemedicine. I understand that the telemedicine visit will involve communicating with the Practitioner through live audiovisual communication technology and the disclosure of certain medical information by electronic transmission. I acknowledge that I have been given the opportunity to request an in-person assessment or other available alternative prior to the telemedicine visit and am voluntarily participating in the telemedicine visit.  I understand that I have the right to withhold or withdraw my consent to the use of telemedicine in the course of my care at any time, without affecting my right to future care or treatment, and that the Practitioner or I may terminate the telemedicine visit at any time. I understand that I have the right to inspect all information obtained and/or recorded in the course of the telemedicine visit and may receive copies of available information for a reasonable fee.  I understand that some of the potential risks of receiving the Services via telemedicine include:  Delay or interruption in medical evaluation due to technological equipment failure or  disruption; Information transmitted may not be sufficient (e.g. poor resolution of images) to allow for appropriate medical decision making by the Practitioner; and/or  In rare instances, security protocols could fail, causing a breach of personal health information.  Furthermore, I acknowledge that it is my responsibility to provide information about my medical history, conditions and care that is complete and accurate to the best of my ability. I acknowledge that Practitioner's advice, recommendations, and/or decision may be based on factors not within their control, such as incomplete or inaccurate data provided by me or distortions of diagnostic images or specimens that may result from electronic transmissions. I understand that the practice of medicine is not an exact science and that Practitioner makes no warranties or guarantees regarding treatment outcomes. I acknowledge that a copy of this consent can be made available to me via my patient portal ([facility]), or I can request a printed copy by calling the office of [facility].    I understand that my insurance will be billed for this visit.   I have read or had this consent read to me. I understand the contents of this consent, which adequately explains the benefits and risks of the Services being provided via telemedicine.  I have been provided ample opportunity to ask questions regarding this consent and the Services and have had my questions answered to my satisfaction. I give my informed consent for the services to be provided through the use of telemedicine in my medical care

## 2022-06-01 NOTE — Telephone Encounter (Signed)
Pt agreeable to plan of care for tele pre op appt 06/21/22 @ 3 pm. Meds not done as pt was out to lunch with friends. Consent has been given.

## 2022-06-15 ENCOUNTER — Ambulatory Visit: Payer: Medicare Other

## 2022-06-21 ENCOUNTER — Ambulatory Visit: Payer: Medicare Other | Attending: Physician Assistant | Admitting: Physician Assistant

## 2022-06-21 DIAGNOSIS — I10 Essential (primary) hypertension: Secondary | ICD-10-CM | POA: Diagnosis not present

## 2022-06-21 DIAGNOSIS — Z01810 Encounter for preprocedural cardiovascular examination: Secondary | ICD-10-CM

## 2022-06-21 NOTE — Progress Notes (Signed)
Virtual Visit via Telephone Note   Because of [name]'s co-morbid illnesses, she is at least at moderate risk for complications without adequate follow up.  This format is felt to be most appropriate for this patient at this time.  The patient did not have access to video technology/had technical difficulties with video requiring transitioning to audio format only (telephone).  All issues noted in this document were discussed and addressed.  No physical exam could be performed with this format.  Please refer to the patient's chart for her consent to telehealth for [facility].  Evaluation Performed:  Preoperative cardiovascular risk assessment _____________   Date:  06/21/2022   Patient ID:  Sandra Stevens, Sandra Stevens Jan 08, 1951, MRN [id number] Patient Location:  Home Provider location:   Office  Primary Care Provider:  Deland Pretty, MD Primary Cardiologist:  Minus Breeding, MD  Chief Complaint / Patient Profile   71 y.o. y/o female with a h/o CAD, hypertension and hyperlipidemia who is pending right small finger subtotal palmar fasciotomy with neuroplasty and repair reconstruction and presents today for telephonic preoperative cardiovascular risk assessment.  Past Medical History    Past Medical History:  Diagnosis Date   Arthritis    hx. arthritis, DDD, bursitis(hips)   CAD (coronary artery disease)    Cystitis    past hx none recently.   GERD (gastroesophageal reflux disease)    control by med.   Hemorrhoid    HTN (hypertension)    no meds required per Dr Linna Darner   Hyperlipidemia    Oxygen deficiency    On oxygen at night 2L   PONV (postoperative nausea and vomiting)    nausea   Sleep apnea    Not on CPAP.  On O2   Varicose vein of leg    Past Surgical History:  Procedure Laterality Date   ANKLE SURGERY  Nov 2012   COLONOSCOPY  2010   Dr Henrene Pastor   COLONOSCOPY W/ POLYPECTOMY     done every 5 years due to [place] colon cancer   DILATION AND  CURETTAGE OF UTERUS     endometrial septum  06/11/12   Dr Radene Knee   EYE SURGERY     bilateral lasik eye surgery   facet injections  11/03/13   L4-5,L5-S1; Dr Nelva Bush   KNEE SURGERY     x7   [place] Right    '96   POLYPECTOMY     TARSAL TUNNEL RELEASE     TONSILLECTOMY     TOTAL HIP ARTHROPLASTY Right 08/01/2016   Procedure: RIGHT TOTAL HIP ARTHROPLASTY ANTERIOR APPROACH;  Surgeon: Gaynelle Arabian, MD;  Location: [facility];  Service: Orthopedics;  Laterality: Right;   TOTAL KNEE ARTHROPLASTY  '04 / '05   x2- bilateral.   WISDOM TOOTH EXTRACTION     WRIST SURGERY     x2    Allergies  No Known Allergies  History of Present Illness    Sandra Stevens is a 71 y.o. female who presents via audio/video conferencing for a telehealth visit today.  Pt was last seen in cardiology clinic on 01/09/2022 by Dr. Percival Spanish.  At that time Zyaira Vejar was doing well.  The patient is now pending procedure as outlined above. Since her last visit, she has been doing well without exertional chest pain or worsening dyspnea   Home Medications    Prior to Admission medications   Medication Sig Start Date End Date Taking? Authorizing Provider  AMBULATORY NON FORMULARY  MEDICATION Bone stimulator Use daily S82.891K Fax [phone] 03/08/22   Gregor Hams, MD  amitriptyline (ELAVIL) 25 MG tablet Take 25 mg by mouth at bedtime.    [provider]  aspirin 81 MG chewable tablet Chew 81 mg by mouth daily. 10/02/21   [provider]  cholecalciferol (VITAMIN D) 25 MCG (1000 UNIT) tablet Take 25 mcg by mouth every other day.    [provider]  gabapentin (NEURONTIN) 400 MG capsule Take 1 capsule (400 mg total) by mouth 3 (three) times daily. Patient taking differently: Take 400 mg by mouth at bedtime. 10/03/15   Wallene Huh, DPM  irbesartan (AVAPRO) 300 MG tablet Take 300 mg by mouth daily.    [provider]  irbesartan-hydrochlorothiazide  (AVALIDE) 300-12.5 MG tablet Take 1 tablet by mouth daily. Patient not taking: Reported on 05/08/2022    [provider]  pantoprazole (PROTONIX) 40 MG tablet Take 40 mg by mouth daily.    [provider]  rosuvastatin (CRESTOR) 20 MG tablet Take 20 mg by mouth daily. 10/02/21   [provider]  zaleplon (SONATA) 10 MG capsule Take 10 mg by mouth as needed. 07/02/16   [provider]    Physical Exam    Vital Signs:  Margeret Stachnik does not have vital signs available for review today.  Given telephonic nature of communication, physical exam is limited. AAOx3. NAD. Normal affect.  Speech and respirations are unlabored.  Accessory Clinical Findings    None  Assessment & Plan    1.  Preoperative Cardiovascular Risk Assessment:  -Patient has upcoming right finger surgery.  She denies any recent exertional chest pain or worsening dyspnea.  She had a coronary CT in January 2023 that showed nonobstructive disease.  She is clearly able to accomplish more than 4 METS of activity without any exertional symptoms.  She is cleared to proceed with upcoming procedure and is at acceptable risk.  If needed, she may hold aspirin for 5 to 7 days prior to the surgery and restart as once possible afterward at the surgeon's discretion.   A copy of this note will be routed to requesting surgeon.  Time:   Today, I have spent 4 minutes with the patient with telehealth technology discussing medical history, symptoms, and management plan.     [place], [place]  06/21/2022, 3:05 PM

## 2022-07-03 ENCOUNTER — Other Ambulatory Visit: Payer: Self-pay | Admitting: Orthopedic Surgery

## 2022-07-03 DIAGNOSIS — M720 Palmar fascial fibromatosis [Dupuytren]: Secondary | ICD-10-CM | POA: Diagnosis not present

## 2022-07-03 DIAGNOSIS — G8918 Other acute postprocedural pain: Secondary | ICD-10-CM | POA: Diagnosis not present

## 2022-07-11 DIAGNOSIS — M25641 Stiffness of right hand, not elsewhere classified: Secondary | ICD-10-CM | POA: Diagnosis not present

## 2022-07-12 ENCOUNTER — Ambulatory Visit
Admission: RE | Admit: 2022-07-12 | Discharge: 2022-07-12 | Disposition: A | Discharge status: Home / Self Care | Payer: Medicare Other | Source: Ambulatory Visit | Attending: Internal Medicine | Admitting: Internal Medicine

## 2022-07-12 DIAGNOSIS — Z1231 Encounter for screening mammogram for malignant neoplasm of breast: Secondary | ICD-10-CM | POA: Diagnosis not present

## 2022-07-16 ENCOUNTER — Ambulatory Visit: Payer: Medicare Other

## 2022-07-18 DIAGNOSIS — M1811 Unilateral primary osteoarthritis of first carpometacarpal joint, right hand: Secondary | ICD-10-CM | POA: Diagnosis not present

## 2022-07-18 DIAGNOSIS — Z4789 Encounter for other orthopedic aftercare: Secondary | ICD-10-CM | POA: Diagnosis not present

## 2022-08-06 ENCOUNTER — Ambulatory Visit: Payer: Medicare Other | Admitting: Family Medicine

## 2022-08-06 ENCOUNTER — Ambulatory Visit (INDEPENDENT_AMBULATORY_CARE_PROVIDER_SITE_OTHER): Payer: Medicare Other

## 2022-08-06 VITALS — BP 130/78 | HR 78 | Ht 65.0 in | Wt 260.0 lb

## 2022-08-06 DIAGNOSIS — S82891D Other fracture of right lower leg, subsequent encounter for closed fracture with routine healing: Secondary | ICD-10-CM | POA: Diagnosis not present

## 2022-08-06 DIAGNOSIS — M25571 Pain in right ankle and joints of right foot: Secondary | ICD-10-CM | POA: Diagnosis not present

## 2022-08-06 NOTE — Patient Instructions (Addendum)
Thank you for coming in today.   Please get an Xray now  Continue using the bone stimulator  Recheck as needed.

## 2022-08-06 NOTE — Progress Notes (Signed)
   I, Peterson Lombard, LAT, ATC acting as a scribe for Lynne Leader, MD.  Sandra Stevens is a 71 y.o. female who presents to [place] at [facility] today for her 30-monthf/u of R fibular fx that she injured on 10/08/21.  Patient was last seen by Dr. CGeorgina Snellon 05/08/2022 and was advised to continue using the bone stimulator.  Today, patient reports she hasn't been doing much walking. Since we have seen her last, pt has suffered from CNelsonand is recovering from Dupuytren's contracture surgery. Pt has cont'd to use the bone growth stimulator.  Dx imaging: 05/08/22 R ankle XR 04/04/22 Labs- uric acid             04/04/22 R ankle XR 03/08/22 R ankle XR 01/08/22 R ankle XR 12/08/21 R ankle XR             11/17/21 R ankle XR 10/26/21 R knee & R ankle XR             10/12/21 R ankle XR  Pertinent review of systems: No fevers or chills  Relevant historical information: Hypertension   Exam:  BP 130/78   Pulse 78   Ht '5\' 5"'$  (1.651 m)   Wt 260 lb (117.9 kg)   SpO2 98%   BMI 43.27 kg/m  General: Well Developed, well nourished, and in no acute distress.   MSK: Right ankle normal-appearing Mildly tender palpation lateral malleolus.    Lab and Radiology Results  X-ray images right ankle obtained today personally and independently interpreted. Some progressive interval bridging the fracture line is present. Await formal radiology review     Assessment and Plan: 71y.o. female with right ankle fracture ongoing for about 10 months now.  Clinically doing pretty well incomplete healing based on x-ray.  Continue bone stimulator advance exercise and activity as tolerated recheck as needed.   PDMP not reviewed this encounter. Orders Placed This Encounter  Procedures   DG Ankle Complete Right    Standing Status:   Future    Number of Occurrences:   1    Standing Expiration Date:   08/07/2023    Order Specific Question:   Reason for Exam (SYMPTOM  OR DIAGNOSIS REQUIRED)     Answer:   rigth ankle pain    Order Specific Question:   Preferred imaging location?    Answer:   [facility]  No orders of the defined types were placed in this encounter.    Discussed warning signs or symptoms. Please see discharge instructions. Patient expresses understanding.   The above documentation has been reviewed and is accurate and complete ELynne Leader M.D.

## 2022-08-08 NOTE — Progress Notes (Signed)
Radiology did not see a fracture at this time.  We can probably speculate that it still there to some extent like we talked about in clinic.

## 2022-08-22 DIAGNOSIS — I10 Essential (primary) hypertension: Secondary | ICD-10-CM | POA: Diagnosis not present

## 2022-08-27 DIAGNOSIS — I10 Essential (primary) hypertension: Secondary | ICD-10-CM | POA: Diagnosis not present

## 2022-08-27 DIAGNOSIS — K219 Gastro-esophageal reflux disease without esophagitis: Secondary | ICD-10-CM | POA: Diagnosis not present

## 2022-08-27 DIAGNOSIS — Z23 Encounter for immunization: Secondary | ICD-10-CM | POA: Diagnosis not present

## 2022-08-27 DIAGNOSIS — Z0000 Encounter for general adult medical examination without abnormal findings: Secondary | ICD-10-CM | POA: Diagnosis not present

## 2022-08-28 ENCOUNTER — Other Ambulatory Visit: Payer: Self-pay | Admitting: Internal Medicine

## 2022-08-28 DIAGNOSIS — Z87891 Personal history of nicotine dependence: Secondary | ICD-10-CM

## 2022-09-03 DIAGNOSIS — Z01419 Encounter for gynecological examination (general) (routine) without abnormal findings: Secondary | ICD-10-CM | POA: Diagnosis not present

## 2022-09-03 DIAGNOSIS — Z6841 Body Mass Index (BMI) 40.0 and over, adult: Secondary | ICD-10-CM | POA: Diagnosis not present

## 2022-10-03 ENCOUNTER — Ambulatory Visit
Admission: RE | Admit: 2022-10-03 | Discharge: 2022-10-03 | Disposition: A | Discharge status: Home / Self Care | Payer: Medicare Other | Source: Ambulatory Visit | Attending: Internal Medicine | Admitting: Internal Medicine

## 2022-10-03 ENCOUNTER — Other Ambulatory Visit: Payer: Self-pay | Admitting: Internal Medicine

## 2022-10-03 DIAGNOSIS — Z87891 Personal history of nicotine dependence: Secondary | ICD-10-CM

## 2022-10-24 DIAGNOSIS — Z809 Family history of malignant neoplasm, unspecified: Secondary | ICD-10-CM | POA: Diagnosis not present

## 2022-10-24 DIAGNOSIS — Z1509 Genetic susceptibility to other malignant neoplasm: Secondary | ICD-10-CM | POA: Diagnosis not present

## 2022-10-24 DIAGNOSIS — N3281 Overactive bladder: Secondary | ICD-10-CM | POA: Diagnosis not present

## 2022-12-24 IMAGING — DX DG ANKLE COMPLETE 3+V*R*
3 series · 3 of 3 positions shown · non-contrast
Comparison: Radiograph 03/08/2022

CLINICAL DATA: Closed fracture of right ankle with routine healing,
subsequent encounter. Right ankle pain.

EXAM:
RIGHT ANKLE - COMPLETE 3+ VIEW

[ankle ap]
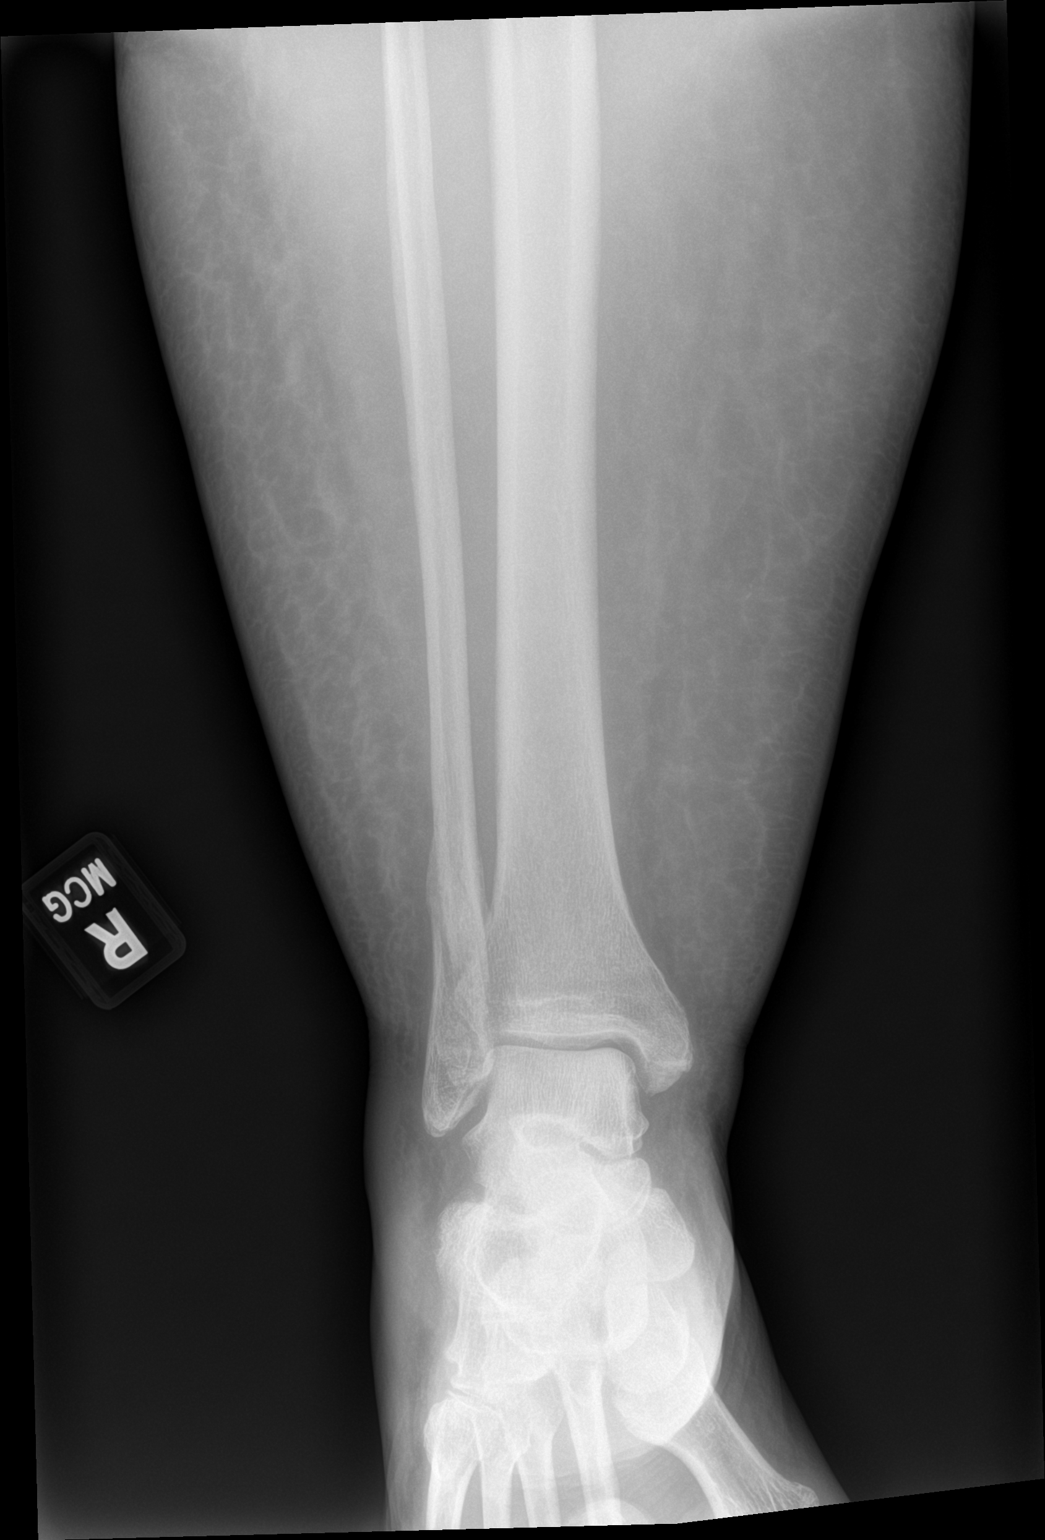

[ankle obl]
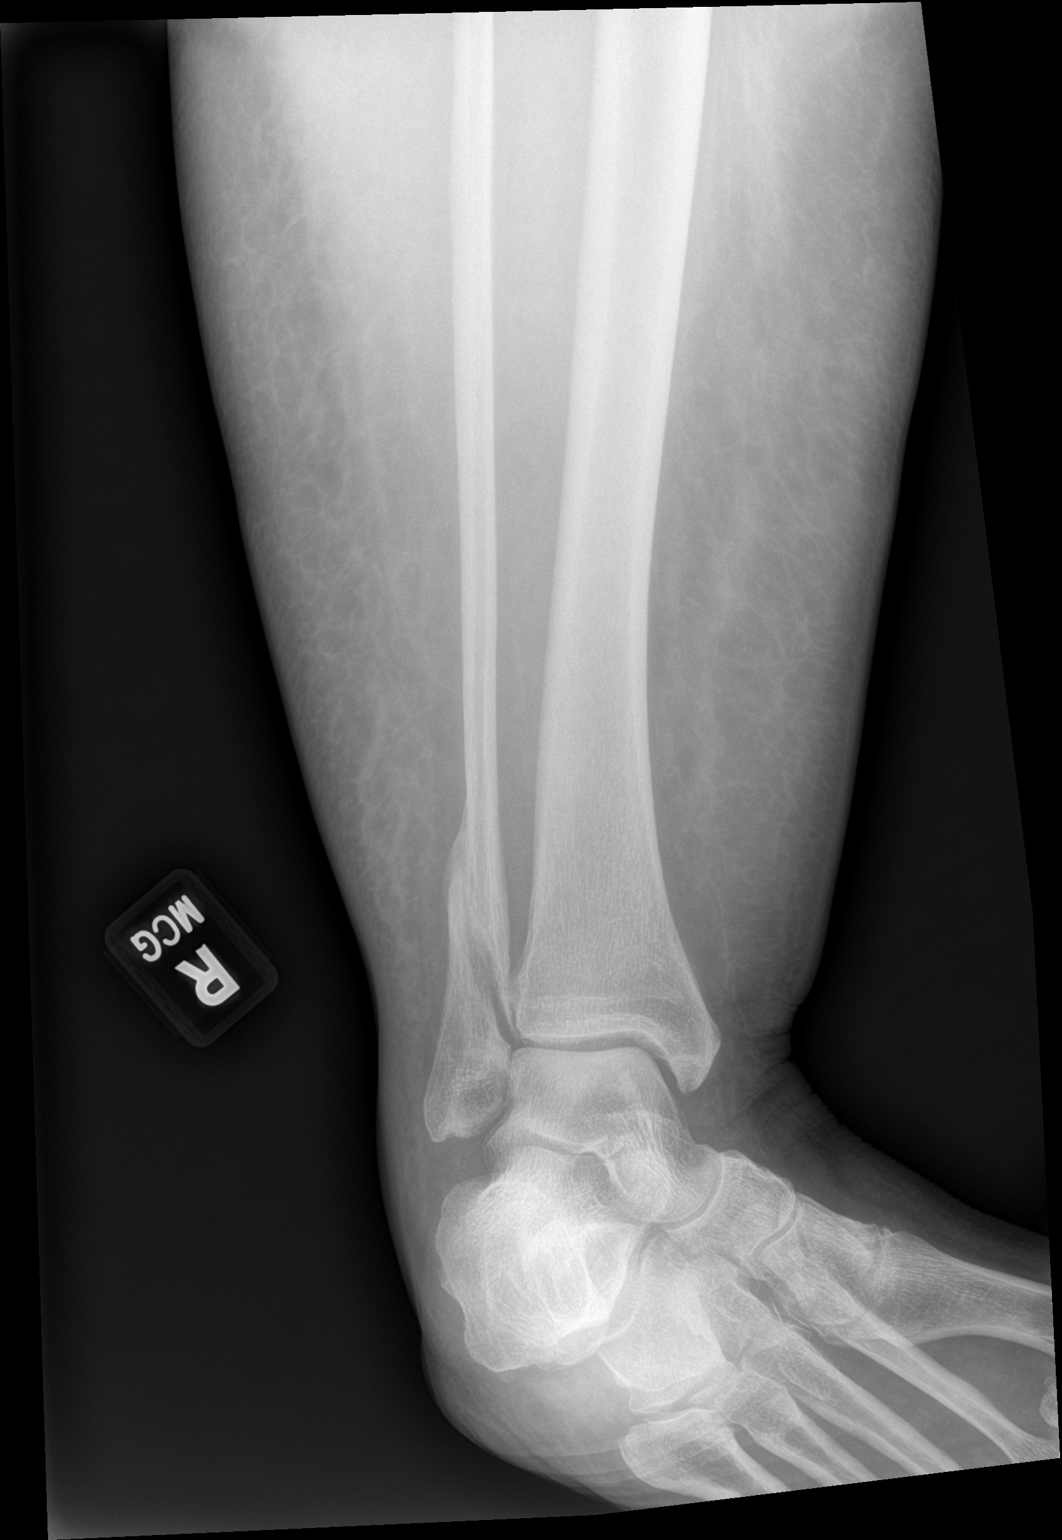

[ankle lat]
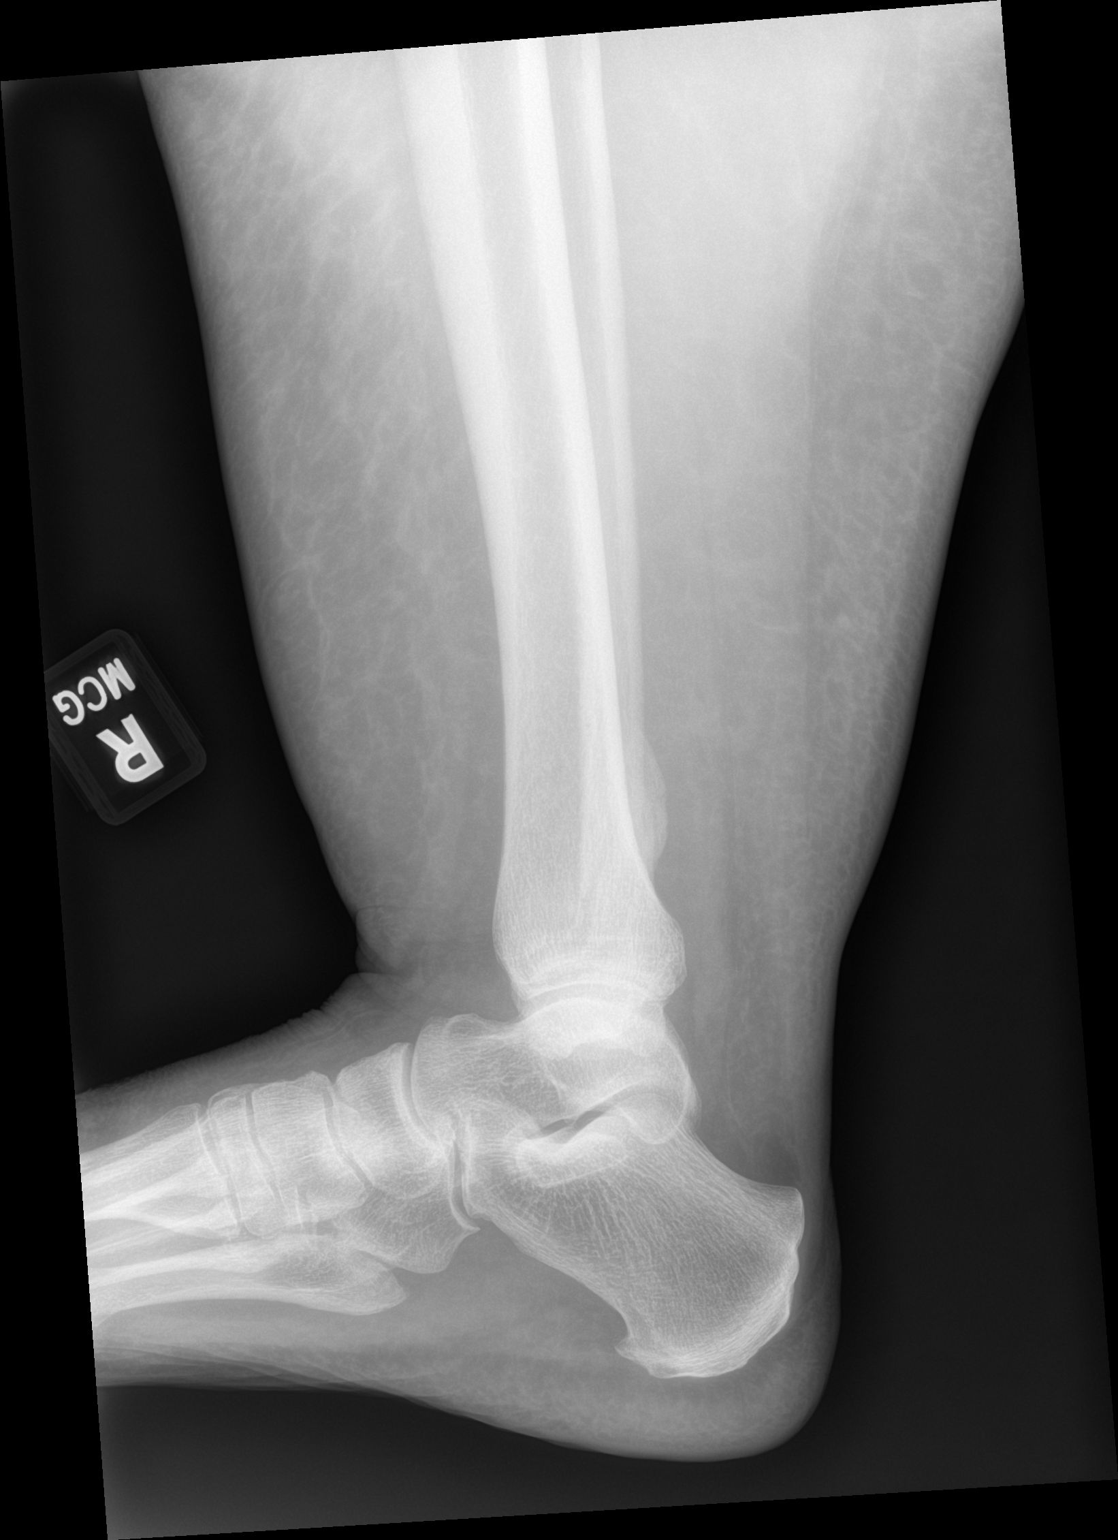

[3 of 3 positions shown; findings below may reference images not displayed]

FINDINGS: No distal tibial fracture is unchanged alignment. There is some
incomplete bony bridging about the proximal aspect with slight
increasing callus formation. The fracture line remains visible in
the mid and distal portion. No new fracture or acute osseous
findings. The degree of soft tissue edema has slightly increased.
IMPRESSION: Healing distal tibial fracture with unchanged alignment, incomplete
bony bridging. Increased soft tissue edema.

## 2023-01-14 DIAGNOSIS — K080 Exfoliation of teeth due to systemic causes: Secondary | ICD-10-CM | POA: Diagnosis not present

## 2023-02-26 DIAGNOSIS — L821 Other seborrheic keratosis: Secondary | ICD-10-CM | POA: Diagnosis not present

## 2023-02-26 DIAGNOSIS — J45909 Unspecified asthma, uncomplicated: Secondary | ICD-10-CM | POA: Diagnosis not present

## 2023-02-26 DIAGNOSIS — D225 Melanocytic nevi of trunk: Secondary | ICD-10-CM | POA: Diagnosis not present

## 2023-02-26 DIAGNOSIS — L578 Other skin changes due to chronic exposure to nonionizing radiation: Secondary | ICD-10-CM | POA: Diagnosis not present

## 2023-02-26 DIAGNOSIS — L814 Other melanin hyperpigmentation: Secondary | ICD-10-CM | POA: Diagnosis not present

## 2023-02-26 DIAGNOSIS — D492 Neoplasm of unspecified behavior of bone, soft tissue, and skin: Secondary | ICD-10-CM | POA: Diagnosis not present

## 2023-02-26 DIAGNOSIS — R932 Abnormal findings on diagnostic imaging of liver and biliary tract: Secondary | ICD-10-CM | POA: Diagnosis not present

## 2023-03-20 NOTE — Progress Notes (Unsigned)
  Cardiology Office Note:   Date:  03/21/2023  ID:  Sandra Stevens, Sandra Stevens June 06, 1951, MRN [id number]  History of Present Illness:   Sandra Stevens is a 72 y.o. female who is referred for evaluation of elevated coronary calcium.  The score was 1,927 that is 99th percentile.    She had a cardiac CTA.  She was found to have LAD 50 - 69% stenosis that was not clinically significant via FFR.  RCA was 25 - 49%.     She did have 1 episode of chest discomfort she thinks about 6 weeks ago.  It was at rest.  She was just walking around [place].  She was not stressed.  It was mid chest.  She actually went home and it just went away and she has not had any other symptoms.  She has not had any symptoms like that since then.  She is not describing any new shortness of breath, PND or orthopnea.  She did have cough with asthmatic bronchitis and has had a time getting over this and has been managed by her primary provider but she is starting to recover and start to become more active.  She was on a research trial for weight loss medication but had to come off of this because it caused lots of side effects and she said that tramadol terminated.  I do not have the details.   ROS: As stated in the HPI and negative for all other systems.  Studies Reviewed:    EKG: Sinus rhythm, rate 68, left axis deviation, left anterior fascicular block, low voltage in the limb and chest leads, poor anterior R wave progression, no acute ST-T wave changes.  This is not different than previous.  Risk Assessment/Calculations:          Physical Exam:   VS:  BP 124/84   Pulse 68   Ht 5\' 5"  (1.651 m)   Wt 268 lb 9.6 oz (121.8 kg)   SpO2 96%   BMI 44.70 kg/m    Wt Readings from Last 3 Encounters:  03/21/23 268 lb 9.6 oz (121.8 kg)  08/06/22 260 lb (117.9 kg)  05/08/22 248 lb 9.6 oz (112.8 kg)     GEN: Well nourished, well developed in no acute distress NECK: No JVD; No carotid bruits CARDIAC: RRR, no  murmurs, rubs, gallops RESPIRATORY:  Clear to auscultation without rales, wheezing or rhonchi  ABDOMEN: Soft, non-tender, non-distended EXTREMITIES:  No edema; No deformity   ASSESSMENT AND PLAN:   CAD:  The patient has no new sypmtoms.  No further cardiovascular testing is indicated.  We will continue with aggressive risk reduction and meds as listed.she had 1 episode of chest pain but otherwise no symptoms that are different than her presenting complaints when we did the CT last year.     HTN: Blood pressure is controlled.  No change in therapy.   DYSLIPIDEMIA: She is going to send me a lipid profile which she has done with a goal LDL below 50.      Signed, Rollene Rotunda, MD

## 2023-03-21 ENCOUNTER — Encounter: Payer: Self-pay | Admitting: Cardiology

## 2023-03-21 ENCOUNTER — Ambulatory Visit: Payer: Medicare Other | Attending: Cardiology | Admitting: Cardiology

## 2023-03-21 VITALS — BP 124/84 | HR 68 | Ht 65.0 in | Wt 268.6 lb

## 2023-03-21 DIAGNOSIS — I2510 Atherosclerotic heart disease of native coronary artery without angina pectoris: Secondary | ICD-10-CM

## 2023-03-21 DIAGNOSIS — I10 Essential (primary) hypertension: Secondary | ICD-10-CM

## 2023-03-21 DIAGNOSIS — E785 Hyperlipidemia, unspecified: Secondary | ICD-10-CM

## 2023-03-21 NOTE — Patient Instructions (Signed)
Medication Instructions:  Continue with your current medications.   Lab Work: None ordered.   Testing/Procedures: None ordered.   Follow-Up: At [facility], you and your health needs are our priority.  As part of our continuing mission to provide you with exceptional heart care, we have created designated Provider Care Teams.  These Care Teams include your primary Cardiologist (physician) and Advanced Practice Providers (APPs -  Physician Assistants and Nurse Practitioners) who all work together to provide you with the care you need, when you need it.  We recommend signing up for the patient portal called "MyChart".  Sign up information is provided on this After Visit Summary.  MyChart is used to connect with patients for Virtual Visits (Telemedicine).  Patients are able to view lab/test results, encounter notes, upcoming appointments, etc.  Non-urgent messages can be sent to your provider as well.   To learn more about what you can do with MyChart, go to [email or web address].    Your next appointment:  1 year.  Provider:  Dr. Antoine Poche

## 2023-03-22 DIAGNOSIS — H17823 Peripheral opacity of cornea, bilateral: Secondary | ICD-10-CM | POA: Diagnosis not present

## 2023-03-22 DIAGNOSIS — H2513 Age-related nuclear cataract, bilateral: Secondary | ICD-10-CM | POA: Diagnosis not present

## 2023-03-22 DIAGNOSIS — H40013 Open angle with borderline findings, low risk, bilateral: Secondary | ICD-10-CM | POA: Diagnosis not present

## 2023-03-22 DIAGNOSIS — H52213 Irregular astigmatism, bilateral: Secondary | ICD-10-CM | POA: Diagnosis not present

## 2023-04-18 DIAGNOSIS — Z0100 Encounter for examination of eyes and vision without abnormal findings: Secondary | ICD-10-CM | POA: Diagnosis not present

## 2023-04-19 DIAGNOSIS — F324 Major depressive disorder, single episode, in partial remission: Secondary | ICD-10-CM | POA: Diagnosis not present

## 2023-04-19 DIAGNOSIS — Z9981 Dependence on supplemental oxygen: Secondary | ICD-10-CM | POA: Diagnosis not present

## 2023-05-30 ENCOUNTER — Other Ambulatory Visit: Payer: Self-pay | Admitting: Internal Medicine

## 2023-05-30 DIAGNOSIS — Z1231 Encounter for screening mammogram for malignant neoplasm of breast: Secondary | ICD-10-CM

## 2023-06-12 DIAGNOSIS — D492 Neoplasm of unspecified behavior of bone, soft tissue, and skin: Secondary | ICD-10-CM | POA: Diagnosis not present

## 2023-06-12 DIAGNOSIS — L570 Actinic keratosis: Secondary | ICD-10-CM | POA: Diagnosis not present

## 2023-07-16 DIAGNOSIS — L910 Hypertrophic scar: Secondary | ICD-10-CM | POA: Diagnosis not present

## 2023-07-16 DIAGNOSIS — L570 Actinic keratosis: Secondary | ICD-10-CM | POA: Diagnosis not present

## 2023-07-18 ENCOUNTER — Ambulatory Visit: Payer: Medicare Other

## 2023-07-18 ENCOUNTER — Ambulatory Visit
Admission: RE | Admit: 2023-07-18 | Discharge: 2023-07-18 | Disposition: A | Discharge status: Home / Self Care | Payer: Medicare Other | Source: Ambulatory Visit | Attending: Internal Medicine | Admitting: Internal Medicine

## 2023-07-18 DIAGNOSIS — K080 Exfoliation of teeth due to systemic causes: Secondary | ICD-10-CM | POA: Diagnosis not present

## 2023-07-18 DIAGNOSIS — Z1231 Encounter for screening mammogram for malignant neoplasm of breast: Secondary | ICD-10-CM

## 2023-08-12 DIAGNOSIS — K080 Exfoliation of teeth due to systemic causes: Secondary | ICD-10-CM | POA: Diagnosis not present

## 2023-08-28 DIAGNOSIS — I10 Essential (primary) hypertension: Secondary | ICD-10-CM | POA: Diagnosis not present

## 2023-09-02 DIAGNOSIS — Z0000 Encounter for general adult medical examination without abnormal findings: Secondary | ICD-10-CM | POA: Diagnosis not present

## 2023-09-02 DIAGNOSIS — I10 Essential (primary) hypertension: Secondary | ICD-10-CM | POA: Diagnosis not present

## 2023-09-02 DIAGNOSIS — K219 Gastro-esophageal reflux disease without esophagitis: Secondary | ICD-10-CM | POA: Diagnosis not present

## 2023-09-02 DIAGNOSIS — N1831 Chronic kidney disease, stage 3a: Secondary | ICD-10-CM | POA: Diagnosis not present

## 2023-09-02 DIAGNOSIS — I2510 Atherosclerotic heart disease of native coronary artery without angina pectoris: Secondary | ICD-10-CM | POA: Diagnosis not present

## 2023-09-02 DIAGNOSIS — E875 Hyperkalemia: Secondary | ICD-10-CM | POA: Diagnosis not present

## 2023-09-02 DIAGNOSIS — R739 Hyperglycemia, unspecified: Secondary | ICD-10-CM | POA: Diagnosis not present

## 2023-09-03 LAB — LAB REPORT - SCANNED
A1c: 6.1
EGFR: 57

## 2023-09-05 DIAGNOSIS — Z01419 Encounter for gynecological examination (general) (routine) without abnormal findings: Secondary | ICD-10-CM | POA: Diagnosis not present

## 2023-09-05 DIAGNOSIS — Z6841 Body Mass Index (BMI) 40.0 and over, adult: Secondary | ICD-10-CM | POA: Diagnosis not present

## 2023-09-23 DIAGNOSIS — D492 Neoplasm of unspecified behavior of bone, soft tissue, and skin: Secondary | ICD-10-CM | POA: Diagnosis not present

## 2023-09-23 DIAGNOSIS — L570 Actinic keratosis: Secondary | ICD-10-CM | POA: Diagnosis not present

## 2023-11-05 DIAGNOSIS — Z872 Personal history of diseases of the skin and subcutaneous tissue: Secondary | ICD-10-CM | POA: Diagnosis not present

## 2023-11-05 DIAGNOSIS — L578 Other skin changes due to chronic exposure to nonionizing radiation: Secondary | ICD-10-CM | POA: Diagnosis not present

## 2024-01-13 DIAGNOSIS — M1811 Unilateral primary osteoarthritis of first carpometacarpal joint, right hand: Secondary | ICD-10-CM | POA: Diagnosis not present

## 2024-01-13 DIAGNOSIS — M79642 Pain in left hand: Secondary | ICD-10-CM | POA: Diagnosis not present

## 2024-01-13 DIAGNOSIS — M65332 Trigger finger, left middle finger: Secondary | ICD-10-CM | POA: Diagnosis not present

## 2024-01-16 DIAGNOSIS — K080 Exfoliation of teeth due to systemic causes: Secondary | ICD-10-CM | POA: Diagnosis not present

## 2024-02-17 DIAGNOSIS — M79641 Pain in right hand: Secondary | ICD-10-CM | POA: Diagnosis not present

## 2024-02-17 DIAGNOSIS — M65332 Trigger finger, left middle finger: Secondary | ICD-10-CM | POA: Diagnosis not present

## 2024-02-17 DIAGNOSIS — M1812 Unilateral primary osteoarthritis of first carpometacarpal joint, left hand: Secondary | ICD-10-CM | POA: Diagnosis not present

## 2024-02-18 DIAGNOSIS — R0989 Other specified symptoms and signs involving the circulatory and respiratory systems: Secondary | ICD-10-CM | POA: Diagnosis not present

## 2024-02-18 DIAGNOSIS — R062 Wheezing: Secondary | ICD-10-CM | POA: Diagnosis not present

## 2024-02-18 DIAGNOSIS — R051 Acute cough: Secondary | ICD-10-CM | POA: Diagnosis not present

## 2024-03-09 DIAGNOSIS — L821 Other seborrheic keratosis: Secondary | ICD-10-CM | POA: Diagnosis not present

## 2024-03-09 DIAGNOSIS — D225 Melanocytic nevi of trunk: Secondary | ICD-10-CM | POA: Diagnosis not present

## 2024-03-09 DIAGNOSIS — L570 Actinic keratosis: Secondary | ICD-10-CM | POA: Diagnosis not present

## 2024-03-09 DIAGNOSIS — S1183XA Puncture wound without foreign body of other specified part of neck, initial encounter: Secondary | ICD-10-CM | POA: Diagnosis not present

## 2024-03-09 DIAGNOSIS — L814 Other melanin hyperpigmentation: Secondary | ICD-10-CM | POA: Diagnosis not present

## 2024-03-25 DIAGNOSIS — H524 Presbyopia: Secondary | ICD-10-CM | POA: Diagnosis not present

## 2024-03-25 DIAGNOSIS — H52213 Irregular astigmatism, bilateral: Secondary | ICD-10-CM | POA: Diagnosis not present

## 2024-03-25 DIAGNOSIS — H353131 Nonexudative age-related macular degeneration, bilateral, early dry stage: Secondary | ICD-10-CM | POA: Diagnosis not present

## 2024-03-25 DIAGNOSIS — H17823 Peripheral opacity of cornea, bilateral: Secondary | ICD-10-CM | POA: Diagnosis not present

## 2024-03-25 DIAGNOSIS — H40013 Open angle with borderline findings, low risk, bilateral: Secondary | ICD-10-CM | POA: Diagnosis not present

## 2024-03-25 DIAGNOSIS — H2513 Age-related nuclear cataract, bilateral: Secondary | ICD-10-CM | POA: Diagnosis not present

## 2024-03-25 NOTE — Progress Notes (Unsigned)
  Cardiology Office Note:   Date:  03/26/2024  ID:  Sandra Stevens, Sandra Stevens 10/21/1951, MRN [id number] PCP: Imelda Man, MD  [facility] Providers Cardiologist:  Eilleen Grates, MD {  History of Present Illness:   Sandra Stevens is a 73 y.o. female who is referred for evaluation of elevated coronary calcium. The score was 1,927 that is 99th percentile. She had a cardiac CTA. She was found to have LAD 50 - 69% stenosis that was not clinically significant via FFR. RCA was 25 - 49%.   She presents for follow up.  The patient denies any new symptoms such as chest discomfort, neck or arm discomfort. There has been no new shortness of breath, PND or orthopnea. There have been no reported palpitations, presyncope or syncope.   She remains active having to take care of her 21 year old mom who lives in [place].  She drives down there frequently.  I still have a house on there although her mom lives in a retirement community.  She has to do a lot of walking when she is there.  She denies any cardiovascular symptoms.  ROS: As stated in the HPI and negative for all other systems.  Studies Reviewed:    EKG:   EKG Interpretation Date/Time:  Thursday March 26 2024 13:54:02 EDT Ventricular Rate:  93 PR Interval:  206 QRS Duration:  88 QT Interval:  360 QTC Calculation: 447 R Axis:   267  Text Interpretation: Normal sinus rhythm Right ventricular hypertrophy Possible Anterolateral infarct (cited on or before 12-Jul-2004) When compared with ECG of  2024 No significant change was found Confirmed by Eilleen Grates ([id number]) on 03/26/2024 1:59:25 PM    Risk Assessment/Calculations:      Physical Exam:   VS:  BP 112/74 (BP Location: Right Arm, Patient Position: Sitting, Cuff Size: Large)   Pulse 93   Ht 5\' 5"  (1.651 m)   Wt 260 lb 3.2 oz (118 kg)   SpO2 93%   BMI 43.30 kg/m    Wt Readings from Last 3 Encounters:  03/26/24 260 lb 3.2 oz (118 kg)  03/21/23 268 lb 9.6 oz (121.8 kg)   08/06/22 260 lb (117.9 kg)     GEN: Well nourished, well developed in no acute distress NECK: No JVD; No carotid bruits CARDIAC: RRR, no murmurs, rubs, gallops RESPIRATORY:  Clear to auscultation without rales, wheezing or rhonchi  ABDOMEN: Soft, non-tender, non-distended EXTREMITIES:  No edema; No deformity   ASSESSMENT AND PLAN:   CAD:  The patient has no new sypmtoms.  No further cardiovascular testing is indicated.  We will continue with aggressive risk reduction and meds as listed.  HTN: Blood pressure is at target.  No change in therapy.   DYSLIPIDEMIA: LDL is 42.  HDL 71.  No change in therapy.    Follow up with me in a couple of years.  Signed, Eilleen Grates, MD

## 2024-03-26 ENCOUNTER — Ambulatory Visit: Payer: Medicare Other | Attending: Cardiology | Admitting: Cardiology

## 2024-03-26 ENCOUNTER — Encounter: Payer: Self-pay | Admitting: Cardiology

## 2024-03-26 VITALS — BP 112/74 | HR 93 | Ht 65.0 in | Wt 260.2 lb

## 2024-03-26 DIAGNOSIS — E785 Hyperlipidemia, unspecified: Secondary | ICD-10-CM | POA: Diagnosis not present

## 2024-03-26 DIAGNOSIS — I10 Essential (primary) hypertension: Secondary | ICD-10-CM | POA: Diagnosis not present

## 2024-03-26 DIAGNOSIS — I2510 Atherosclerotic heart disease of native coronary artery without angina pectoris: Secondary | ICD-10-CM | POA: Diagnosis not present

## 2024-03-26 NOTE — Patient Instructions (Signed)
 Medication Instructions:  Your physician recommends that you continue on your current medications as directed. Please refer to the Current Medication list given to you today.  *If you need a refill on your cardiac medications before your next appointment, please call your pharmacy*  Lab Work: NONE If you have labs (blood work) drawn today and your tests are completely normal, you will receive your results only by: MyChart Message (if you have MyChart) OR A paper copy in the mail If you have any lab test that is abnormal or we need to change your treatment, we will call you to review the results.  Testing/Procedures: NONE  Follow-Up: At [facility] you and your health needs are our priority.  As part of our continuing mission to provide you with exceptional heart care, our providers are all part of one team.  This team includes your primary Cardiologist (physician) and Advanced Practice Providers or APPs (Physician Assistants and Nurse Practitioners) who all work together to provide you with the care you need, when you need it.  Your next appointment:   2 year(s)  Provider:   Eilleen Grates, MD    We recommend signing up for the patient portal called "MyChart".  Sign up information is provided on this After Visit Summary.  MyChart is used to connect with patients for Virtual Visits (Telemedicine).  Patients are able to view lab/test results, encounter notes, upcoming appointments, etc.  Non-urgent messages can be sent to your provider as well.   To learn more about what you can do with MyChart, go to [email or web address].

## 2024-04-30 DIAGNOSIS — M1811 Unilateral primary osteoarthritis of first carpometacarpal joint, right hand: Secondary | ICD-10-CM | POA: Diagnosis not present

## 2024-04-30 DIAGNOSIS — M65332 Trigger finger, left middle finger: Secondary | ICD-10-CM | POA: Diagnosis not present

## 2024-06-18 ENCOUNTER — Other Ambulatory Visit: Payer: Self-pay | Admitting: Internal Medicine

## 2024-06-18 DIAGNOSIS — Z1231 Encounter for screening mammogram for malignant neoplasm of breast: Secondary | ICD-10-CM

## 2024-07-20 DIAGNOSIS — M65332 Trigger finger, left middle finger: Secondary | ICD-10-CM | POA: Diagnosis not present

## 2024-07-20 DIAGNOSIS — M1811 Unilateral primary osteoarthritis of first carpometacarpal joint, right hand: Secondary | ICD-10-CM | POA: Diagnosis not present

## 2024-07-21 ENCOUNTER — Ambulatory Visit
Admission: RE | Admit: 2024-07-21 | Discharge: 2024-07-21 | Disposition: A | Discharge status: Home / Self Care | Source: Ambulatory Visit | Attending: Internal Medicine | Admitting: Internal Medicine

## 2024-07-21 DIAGNOSIS — Z1231 Encounter for screening mammogram for malignant neoplasm of breast: Secondary | ICD-10-CM

## 2024-07-27 DIAGNOSIS — Z4789 Encounter for other orthopedic aftercare: Secondary | ICD-10-CM | POA: Diagnosis not present

## 2024-07-27 DIAGNOSIS — M65332 Trigger finger, left middle finger: Secondary | ICD-10-CM | POA: Diagnosis not present

## 2024-07-28 DIAGNOSIS — K080 Exfoliation of teeth due to systemic causes: Secondary | ICD-10-CM | POA: Diagnosis not present

## 2024-08-10 DIAGNOSIS — M65332 Trigger finger, left middle finger: Secondary | ICD-10-CM | POA: Diagnosis not present

## 2024-08-24 DIAGNOSIS — M65332 Trigger finger, left middle finger: Secondary | ICD-10-CM | POA: Diagnosis not present

## 2024-08-31 DIAGNOSIS — R739 Hyperglycemia, unspecified: Secondary | ICD-10-CM | POA: Diagnosis not present

## 2024-09-03 DIAGNOSIS — N1831 Chronic kidney disease, stage 3a: Secondary | ICD-10-CM | POA: Diagnosis not present

## 2024-09-03 DIAGNOSIS — I10 Essential (primary) hypertension: Secondary | ICD-10-CM | POA: Diagnosis not present

## 2024-09-03 DIAGNOSIS — I700 Atherosclerosis of aorta: Secondary | ICD-10-CM | POA: Diagnosis not present

## 2024-09-03 DIAGNOSIS — I2510 Atherosclerotic heart disease of native coronary artery without angina pectoris: Secondary | ICD-10-CM | POA: Diagnosis not present

## 2024-09-03 DIAGNOSIS — Z0000 Encounter for general adult medical examination without abnormal findings: Secondary | ICD-10-CM | POA: Diagnosis not present

## 2024-09-04 ENCOUNTER — Other Ambulatory Visit: Payer: Self-pay | Admitting: Internal Medicine

## 2024-09-04 DIAGNOSIS — F17210 Nicotine dependence, cigarettes, uncomplicated: Secondary | ICD-10-CM

## 2024-09-10 ENCOUNTER — Encounter: Payer: Self-pay | Admitting: Internal Medicine

## 2024-09-15 DIAGNOSIS — M8588 Other specified disorders of bone density and structure, other site: Secondary | ICD-10-CM | POA: Diagnosis not present

## 2024-09-15 DIAGNOSIS — Z6841 Body Mass Index (BMI) 40.0 and over, adult: Secondary | ICD-10-CM | POA: Diagnosis not present

## 2024-09-15 DIAGNOSIS — Z01419 Encounter for gynecological examination (general) (routine) without abnormal findings: Secondary | ICD-10-CM | POA: Diagnosis not present

## 2024-09-16 DIAGNOSIS — M65332 Trigger finger, left middle finger: Secondary | ICD-10-CM | POA: Diagnosis not present

## 2024-09-22 ENCOUNTER — Inpatient Hospital Stay
Admission: RE | Admit: 2024-09-22 | Discharge: 2024-09-22 | Disposition: A | Source: Ambulatory Visit | Attending: Internal Medicine | Admitting: Internal Medicine

## 2024-09-22 DIAGNOSIS — F17210 Nicotine dependence, cigarettes, uncomplicated: Secondary | ICD-10-CM

## 2024-10-05 DIAGNOSIS — M65332 Trigger finger, left middle finger: Secondary | ICD-10-CM | POA: Diagnosis not present
